# Patient Record
Sex: Female | Born: 1945
Health system: Southern US, Community
[De-identification: ages and names within clinical notes are randomized; demographics above are authoritative.]

## PROBLEM LIST (undated history)

## (undated) DIAGNOSIS — E079 Disorder of thyroid, unspecified: Secondary | ICD-10-CM

## (undated) DIAGNOSIS — I10 Essential (primary) hypertension: Secondary | ICD-10-CM

## (undated) DIAGNOSIS — C801 Malignant (primary) neoplasm, unspecified: Secondary | ICD-10-CM

## (undated) HISTORY — PX: ABDOMINAL HYSTERECTOMY: SHX81

## (undated) HISTORY — PX: BREAST SURGERY: SHX581

## (undated) HISTORY — PX: CHOLECYSTECTOMY: SHX55

## (undated) HISTORY — PX: MASTECTOMY: SHX3

---

## 2004-02-13 ENCOUNTER — Ambulatory Visit: Payer: Self-pay | Admitting: Internal Medicine

## 2010-01-23 ENCOUNTER — Ambulatory Visit (HOSPITAL_COMMUNITY): Admission: RE | Admit: 2010-01-23 | Discharge: 2010-01-23 | Payer: Self-pay | Admitting: Internal Medicine

## 2010-01-23 ENCOUNTER — Ambulatory Visit: Payer: Self-pay | Admitting: Internal Medicine

## 2011-08-29 ENCOUNTER — Encounter: Payer: Self-pay | Admitting: Internal Medicine

## 2011-08-29 DIAGNOSIS — M899 Disorder of bone, unspecified: Secondary | ICD-10-CM

## 2011-08-29 DIAGNOSIS — C50919 Malignant neoplasm of unspecified site of unspecified female breast: Secondary | ICD-10-CM

## 2011-08-29 DIAGNOSIS — M949 Disorder of cartilage, unspecified: Secondary | ICD-10-CM

## 2012-08-26 ENCOUNTER — Encounter (INDEPENDENT_AMBULATORY_CARE_PROVIDER_SITE_OTHER): Payer: Medicare Other | Admitting: Internal Medicine

## 2012-08-26 DIAGNOSIS — M899 Disorder of bone, unspecified: Secondary | ICD-10-CM

## 2012-08-26 DIAGNOSIS — C50919 Malignant neoplasm of unspecified site of unspecified female breast: Secondary | ICD-10-CM

## 2012-08-26 DIAGNOSIS — M949 Disorder of cartilage, unspecified: Secondary | ICD-10-CM

## 2015-03-08 DIAGNOSIS — Z853 Personal history of malignant neoplasm of breast: Secondary | ICD-10-CM | POA: Diagnosis not present

## 2015-03-08 DIAGNOSIS — Z9011 Acquired absence of right breast and nipple: Secondary | ICD-10-CM | POA: Diagnosis not present

## 2015-03-08 DIAGNOSIS — Z1231 Encounter for screening mammogram for malignant neoplasm of breast: Secondary | ICD-10-CM | POA: Diagnosis not present

## 2015-03-13 DIAGNOSIS — K8018 Calculus of gallbladder with other cholecystitis without obstruction: Secondary | ICD-10-CM | POA: Diagnosis not present

## 2015-03-13 DIAGNOSIS — K219 Gastro-esophageal reflux disease without esophagitis: Secondary | ICD-10-CM | POA: Diagnosis not present

## 2015-03-13 DIAGNOSIS — M1990 Unspecified osteoarthritis, unspecified site: Secondary | ICD-10-CM | POA: Diagnosis not present

## 2015-03-13 DIAGNOSIS — M47812 Spondylosis without myelopathy or radiculopathy, cervical region: Secondary | ICD-10-CM | POA: Diagnosis not present

## 2015-03-13 DIAGNOSIS — Z79899 Other long term (current) drug therapy: Secondary | ICD-10-CM | POA: Diagnosis not present

## 2015-03-13 DIAGNOSIS — K8050 Calculus of bile duct without cholangitis or cholecystitis without obstruction: Secondary | ICD-10-CM | POA: Diagnosis not present

## 2015-03-13 DIAGNOSIS — I10 Essential (primary) hypertension: Secondary | ICD-10-CM | POA: Diagnosis not present

## 2015-03-13 DIAGNOSIS — Z880 Allergy status to penicillin: Secondary | ICD-10-CM | POA: Diagnosis not present

## 2015-03-13 DIAGNOSIS — K8010 Calculus of gallbladder with chronic cholecystitis without obstruction: Secondary | ICD-10-CM | POA: Diagnosis not present

## 2015-03-13 DIAGNOSIS — Z853 Personal history of malignant neoplasm of breast: Secondary | ICD-10-CM | POA: Diagnosis not present

## 2015-03-13 DIAGNOSIS — Z9011 Acquired absence of right breast and nipple: Secondary | ICD-10-CM | POA: Diagnosis not present

## 2015-03-13 DIAGNOSIS — G629 Polyneuropathy, unspecified: Secondary | ICD-10-CM | POA: Diagnosis not present

## 2015-03-13 DIAGNOSIS — F419 Anxiety disorder, unspecified: Secondary | ICD-10-CM | POA: Diagnosis not present

## 2015-03-13 DIAGNOSIS — E039 Hypothyroidism, unspecified: Secondary | ICD-10-CM | POA: Diagnosis not present

## 2015-04-10 DIAGNOSIS — E039 Hypothyroidism, unspecified: Secondary | ICD-10-CM | POA: Diagnosis not present

## 2015-04-10 DIAGNOSIS — I10 Essential (primary) hypertension: Secondary | ICD-10-CM | POA: Diagnosis not present

## 2015-04-27 DIAGNOSIS — K219 Gastro-esophageal reflux disease without esophagitis: Secondary | ICD-10-CM | POA: Diagnosis not present

## 2015-04-27 DIAGNOSIS — Z87891 Personal history of nicotine dependence: Secondary | ICD-10-CM | POA: Diagnosis not present

## 2015-04-27 DIAGNOSIS — Z6823 Body mass index (BMI) 23.0-23.9, adult: Secondary | ICD-10-CM | POA: Diagnosis not present

## 2015-04-30 DIAGNOSIS — K219 Gastro-esophageal reflux disease without esophagitis: Secondary | ICD-10-CM | POA: Diagnosis not present

## 2015-05-02 ENCOUNTER — Emergency Department (HOSPITAL_COMMUNITY): Payer: Medicare Other

## 2015-05-02 ENCOUNTER — Encounter (HOSPITAL_COMMUNITY): Payer: Self-pay | Admitting: Emergency Medicine

## 2015-05-02 ENCOUNTER — Emergency Department (HOSPITAL_COMMUNITY)
Admission: EM | Admit: 2015-05-02 | Discharge: 2015-05-02 | Disposition: A | Discharge status: Home / Self Care | Payer: Medicare Other | Attending: Emergency Medicine | Admitting: Emergency Medicine

## 2015-05-02 DIAGNOSIS — R1011 Right upper quadrant pain: Secondary | ICD-10-CM | POA: Insufficient documentation

## 2015-05-02 DIAGNOSIS — R7989 Other specified abnormal findings of blood chemistry: Secondary | ICD-10-CM

## 2015-05-02 DIAGNOSIS — Z859 Personal history of malignant neoplasm, unspecified: Secondary | ICD-10-CM | POA: Insufficient documentation

## 2015-05-02 DIAGNOSIS — K5900 Constipation, unspecified: Secondary | ICD-10-CM

## 2015-05-02 DIAGNOSIS — R21 Rash and other nonspecific skin eruption: Secondary | ICD-10-CM | POA: Insufficient documentation

## 2015-05-02 DIAGNOSIS — Z8639 Personal history of other endocrine, nutritional and metabolic disease: Secondary | ICD-10-CM | POA: Insufficient documentation

## 2015-05-02 DIAGNOSIS — I10 Essential (primary) hypertension: Secondary | ICD-10-CM | POA: Insufficient documentation

## 2015-05-02 DIAGNOSIS — R110 Nausea: Secondary | ICD-10-CM | POA: Insufficient documentation

## 2015-05-02 DIAGNOSIS — Z9049 Acquired absence of other specified parts of digestive tract: Secondary | ICD-10-CM | POA: Diagnosis not present

## 2015-05-02 DIAGNOSIS — R946 Abnormal results of thyroid function studies: Secondary | ICD-10-CM | POA: Diagnosis not present

## 2015-05-02 HISTORY — DX: Essential (primary) hypertension: I10

## 2015-05-02 HISTORY — DX: Disorder of thyroid, unspecified: E07.9

## 2015-05-02 HISTORY — DX: Malignant (primary) neoplasm, unspecified: C80.1

## 2015-05-02 LAB — CBC WITH DIFFERENTIAL/PLATELET
BASOS ABS: 0 10*3/uL (ref 0.0–0.1)
Basophils Relative: 1 %
EOS ABS: 0.1 10*3/uL (ref 0.0–0.7)
EOS PCT: 1 %
HCT: 43.8 % (ref 36.0–46.0)
HEMOGLOBIN: 15.1 g/dL — AB (ref 12.0–15.0)
LYMPHS PCT: 14 %
Lymphs Abs: 0.9 10*3/uL (ref 0.7–4.0)
MCH: 32.6 pg (ref 26.0–34.0)
MCHC: 34.5 g/dL (ref 30.0–36.0)
MCV: 94.6 fL (ref 78.0–100.0)
Monocytes Absolute: 0.4 10*3/uL (ref 0.1–1.0)
Monocytes Relative: 6 %
NEUTROS PCT: 78 %
Neutro Abs: 5.1 10*3/uL (ref 1.7–7.7)
PLATELETS: 253 10*3/uL (ref 150–400)
RBC: 4.63 MIL/uL (ref 3.87–5.11)
RDW: 13.1 % (ref 11.5–15.5)
WBC: 6.5 10*3/uL (ref 4.0–10.5)

## 2015-05-02 LAB — COMPREHENSIVE METABOLIC PANEL
ALK PHOS: 59 U/L (ref 38–126)
ALT: 17 U/L (ref 14–54)
AST: 21 U/L (ref 15–41)
Albumin: 4.6 g/dL (ref 3.5–5.0)
Anion gap: 7 (ref 5–15)
BUN: 19 mg/dL (ref 6–20)
CHLORIDE: 104 mmol/L (ref 101–111)
CO2: 29 mmol/L (ref 22–32)
CREATININE: 0.84 mg/dL (ref 0.44–1.00)
Calcium: 9.7 mg/dL (ref 8.9–10.3)
GFR calc Af Amer: >60 mL/min (ref >60)
GFR calc non Af Amer: >60 mL/min (ref >60)
GLUCOSE: 106 mg/dL — AB (ref 65–99)
Potassium: 3.9 mmol/L (ref 3.5–5.1)
SODIUM: 140 mmol/L (ref 135–145)
Total Bilirubin: 1.3 mg/dL — ABNORMAL HIGH (ref 0.3–1.2)
Total Protein: 7.6 g/dL (ref 6.5–8.1)

## 2015-05-02 LAB — URINALYSIS, ROUTINE W REFLEX MICROSCOPIC
Bilirubin Urine: NEGATIVE
Glucose, UA: NEGATIVE mg/dL
Hgb urine dipstick: NEGATIVE
KETONES UR: NEGATIVE mg/dL
LEUKOCYTES UA: NEGATIVE
Nitrite: NEGATIVE
PH: 6.5 (ref 5.0–8.0)
Protein, ur: NEGATIVE mg/dL
Specific Gravity, Urine: 1.01 (ref 1.005–1.030)

## 2015-05-02 LAB — TSH
TSH: 11.697 u[IU]/mL — AB (ref 0.350–4.500)
TSH: 11.67 u[IU]/mL — AB (ref <=5.90)

## 2015-05-02 LAB — LIPASE, BLOOD: Lipase: 49 U/L (ref 11–51)

## 2015-05-02 MED ORDER — IOHEXOL 300 MG/ML  SOLN
100.0000 mL | Freq: Once | INTRAMUSCULAR | Status: AC | PRN
  Administered 2015-05-02: 100 mL via INTRAVENOUS

## 2015-05-02 MED ORDER — DIATRIZOATE MEGLUMINE & SODIUM 66-10 % PO SOLN
ORAL | Status: AC
  Filled 2015-05-02: qty 30

## 2015-05-02 NOTE — ED Notes (Signed)
C/o abdominal pain since 10 th of Jan.  Having pain on and off since surgery.  Currently rates pain 4/10.  Denies any n/v/d.

## 2015-05-02 NOTE — Discharge Instructions (Signed)
Abdominal Pain, Adult Many things can cause belly (abdominal) pain. Most times, the belly pain is not dangerous. Many cases of belly pain can be watched and treated at home. HOME CARE   Do not take medicines that help you go poop (laxatives) unless told to by your doctor.  Only take medicine as told by your doctor.  Eat or drink as told by your doctor. Your doctor will tell you if you should be on a special diet. GET HELP IF:  You do not know what is causing your belly pain.  You have belly pain while you are sick to your stomach (nauseous) or have runny poop (diarrhea).  You have pain while you pee or poop.  Your belly pain wakes you up at night.  You have belly pain that gets worse or better when you eat.  You have belly pain that gets worse when you eat fatty foods.  You have a fever. GET HELP RIGHT AWAY IF:   The pain does not go away within 2 hours.  You keep throwing up (vomiting).  The pain changes and is only in the right or left part of the belly.  You have bloody or tarry looking poop. MAKE SURE YOU:   Understand these instructions.  Will watch your condition.  Will get help right away if you are not doing well or get worse.   This information is not intended to replace advice given to you by your health care provider. Make sure you discuss any questions you have with your health care provider.   Document Released: 08/06/2007 Document Revised: 03/10/2014 Document Reviewed: 10/27/2012 Elsevier Interactive Patient Education 2016 [employer].  Constipation, Adult Constipation is when a person:  Poops (has a bowel movement) less than 3 times a week.  Has a hard time pooping.  Has poop that is dry, hard, or bigger than normal. HOME CARE   Eat foods with a lot of fiber in them. This includes fruits, vegetables, beans, and whole grains such as brown rice.  Avoid fatty foods and foods with a lot of sugar. This includes french fries, hamburgers, cookies,  candy, and soda.  If you are not getting enough fiber from food, take products with added fiber in them (supplements).  Drink enough fluid to keep your pee (urine) clear or pale yellow.  Exercise on a regular basis, or as told by your doctor.  Go to the restroom when you feel like you need to poop. Do not hold it.  Only take medicine as told by your doctor. Do not take medicines that help you poop (laxatives) without talking to your doctor first. GET HELP RIGHT AWAY IF:   You have bright red blood in your poop (stool).  Your constipation lasts more than 4 days or gets worse.  You have belly (abdominal) or butt (rectal) pain.  You have thin poop (as thin as a pencil).  You lose weight, and it cannot be explained. MAKE SURE YOU:   Understand these instructions.  Will watch your condition.  Will get help right away if you are not doing well or get worse.   This information is not intended to replace advice given to you by your health care provider. Make sure you discuss any questions you have with your health care provider.   Document Released: 08/06/2007 Document Revised: 03/10/2014 Document Reviewed: 11/29/2012 Elsevier Interactive Patient Education 2016 [employer].  Hypothyroidism Hypothyroidism is a disorder of the thyroid. The thyroid is a large gland that  is located in the lower front of the neck. The thyroid releases hormones that control how the body works. With hypothyroidism, the thyroid does not make enough of these hormones. CAUSES Causes of hypothyroidism may include:  Viral infections.  Pregnancy.  Your own defense system (immune system) attacking your thyroid.  Certain medicines.  Birth defects.  Past radiation treatments to your head or neck.  Past treatment with radioactive iodine.  Past surgical removal of part or all of your thyroid.  Problems with the gland that is located in the center of your brain (pituitary). SIGNS AND SYMPTOMS Signs  and symptoms of hypothyroidism may include:  Feeling as though you have no energy (lethargy).  Inability to tolerate cold.  Weight gain that is not explained by a change in diet or exercise habits.  Dry skin.  Coarse hair.  Menstrual irregularity.  Slowing of thought processes.  Constipation.  Sadness or depression. DIAGNOSIS  Your health care provider may diagnose hypothyroidism with blood tests and ultrasound tests. TREATMENT Hypothyroidism is treated with medicine that replaces the hormones that your body does not make. After you begin treatment, it may take several weeks for symptoms to go away. HOME CARE INSTRUCTIONS   Take medicines only as directed by your health care provider.  If you start taking any new medicines, tell your health care provider.  Keep all follow-up visits as directed by your health care provider. This is important. As your condition improves, your dosage needs may change. You will need to have blood tests regularly so that your health care provider can watch your condition. SEEK MEDICAL CARE IF:  Your symptoms do not get better with treatment.  You are taking thyroid replacement medicine and:  You sweat excessively.  You have tremors.  You feel anxious.  You lose weight rapidly.  You cannot tolerate heat.  You have emotional swings.  You have diarrhea.  You feel weak. SEEK IMMEDIATE MEDICAL CARE IF:   You develop chest pain.  You develop an irregular heartbeat.  You develop a rapid heartbeat.   This information is not intended to replace advice given to you by your health care provider. Make sure you discuss any questions you have with your health care provider.   Document Released: 02/17/2005 Document Revised: 03/10/2014 Document Reviewed: 07/05/2013 Elsevier Interactive Patient Education [employer].

## 2015-05-02 NOTE — ED Provider Notes (Signed)
CSN: [id number]     Arrival date & time 05/02/15  [id number] History   First MD Initiated Contact with Patient 05/02/15 0840     Chief Complaint  Patient presents with  . Abdominal Pain     (Consider location/radiation/quality/duration/timing/severity/associated sxs/prior Treatment) HPI   Lori Dorsey is a 70 y.o. female who presents to the Emergency Department complaining of intermittent right upper abdominal pain for one month.  She is s/p cholecystectomy in January and reports frequent, worsening pains since that time.  She reports associated nausea, and feeling "bloated" and burning to her stomach.  She followed up with her surgeon in early February and he has released her.  She states pain is not associated with food.  Nothing seems to make the pain better or worse.  She denies chest pain, shortness of breath, fever, vomiting or significant diarrhea.  She also reports a persistent rash to both lower extremities that has been worsening for several weeks.  She states that she has had this rash for some time and her PMD states the rash is associated with her thyroid medication.  She is concerned that her thyroid levels may be abnormal.    Past Medical History  Diagnosis Date  . Hypertension   . Thyroid disease   . Cancer [facility])    Past Surgical History  Procedure Laterality Date  . Abdominal hysterectomy    . Mastectomy      right  . Breast surgery    . Cholecystectomy      Jan. 10 th 2017   History reviewed. No pertinent family history. Social History  Substance Use Topics  . Smoking status: Never Smoker   . Smokeless tobacco: None  . Alcohol Use: No   OB History    No data available     Review of Systems  Constitutional: Positive for appetite change. Negative for fever, chills and activity change.  HENT: Negative for trouble swallowing.   Respiratory: Negative for chest tightness and shortness of breath.   Cardiovascular: Negative for chest pain.  Gastrointestinal:  Positive for nausea and abdominal pain. Negative for vomiting, diarrhea and blood in stool.  Genitourinary: Negative for dysuria, flank pain, decreased urine volume and difficulty urinating.  Musculoskeletal: Negative for back pain.  Skin: Positive for rash. Negative for color change.  Neurological: Negative for dizziness, weakness and numbness.  Hematological: Negative for adenopathy.  All other systems reviewed and are negative.     Allergies  Review of patient's allergies indicates no known allergies.  Home Medications   Prior to Admission medications   Not on File   BP 185/94 mmHg  Pulse 84  Temp(Src) 97.7 F (36.5 C) (Oral)  Resp 16  SpO2 100% Physical Exam  Constitutional: She is oriented to person, place, and time. She appears well-developed and well-nourished. No distress.  HENT:  Head: Normocephalic and atraumatic.  Mouth/Throat: Oropharynx is clear and moist.  Cardiovascular: Normal rate, regular rhythm, normal heart sounds and intact distal pulses.   No murmur heard. Pulmonary/Chest: Effort normal and breath sounds normal. No respiratory distress.  Abdominal: Soft. Bowel sounds are normal. She exhibits no distension and no mass. There is tenderness. There is no rebound and no guarding.  Mild RUQ tenderness on exam.  No guarding or rebound tenderness.  Surgical incisions appear well healed.  No surrounding erythema  Musculoskeletal: Normal range of motion. She exhibits no edema.  Neurological: She is alert and oriented to person, place, and time. She exhibits normal muscle tone.  Coordination normal.  Skin: Skin is warm and dry. Rash noted.  Macular, erythematous rash to the bilateral LE's    Nursing note and vitals reviewed.   ED Course  Procedures (including critical care time) Labs Review Labs Reviewed  CBC WITH DIFFERENTIAL/PLATELET - Abnormal; Notable for the following:    Hemoglobin 15.1 (*)    All other components within normal limits  COMPREHENSIVE  METABOLIC PANEL - Abnormal; Notable for the following:    Glucose, Bld 106 (*)    Total Bilirubin 1.3 (*)    All other components within normal limits  TSH - Abnormal; Notable for the following:    TSH 11.697 (*)    All other components within normal limits  LIPASE, BLOOD  URINALYSIS, ROUTINE W REFLEX MICROSCOPIC (NOT AT [facility])    Imaging Review Ct Abdomen Pelvis W Contrast  05/02/2015  CLINICAL DATA:  Right upper quadrant pain.  Recent cholecystectomy. EXAM: CT ABDOMEN AND PELVIS WITH CONTRAST TECHNIQUE: Multidetector CT imaging of the abdomen and pelvis was performed using the standard protocol following bolus administration of intravenous contrast. CONTRAST:  111mL OMNIPAQUE IOHEXOL 300 MG/ML  SOLN COMPARISON:  02/14/2015 FINDINGS: Lower chest: Lung bases are clear. No effusions. Heart is normal size. Hepatobiliary: Prior cholecystectomy. Focal fatty infiltration near the falciform ligament. No focal abnormalities otherwise. No biliary duct dilatation. Pancreas: Normal Spleen: Normal Adrenals/Urinary Tract: Mild fullness of the right adrenal gland compatible with hyperplasia, stable. Left adrenal and kidneys are unremarkable. Small cyst in the lower pole of the right kidney. No hydronephrosis. Stomach/Bowel: Moderate stool burden throughout the colon. Stomach and small bowel are decompressed. Appendix is visualized and is normal. Vascular/Lymphatic: Aorta is normal caliber.  No adenopathy. Other: No free fluid or free air. Musculoskeletal: No acute bony abnormality or focal bone lesion. Cough IMPRESSION: Prior cholecystectomy.  No acute findings in the abdomen or pelvis. Electronically Signed   By: Rolm Baptise M.D.   On: 05/02/2015 10:46   I have personally reviewed and evaluated these images and lab results as part of my medical decision-making.   EKG Interpretation None      MDM   Final diagnoses:  Right upper quadrant pain  Constipation, unspecified constipation type  Elevated TSH     Pt is well appearing, non-toxic.  Mild RUQ pain, s/p 2 months cholecystectomy.  Labs and CT scan results are reassuring.  No concerning sx's for acute abdomen.  Discussed Miralax for constipation relief and possible stone of CBD.  She appears stable for d/c and agrees to tx plan and close f/u with her surgeon if no relief from the miralax.  Also agrees to close PMD f/u regarding her elevated TSH    Kem Parkinson, PA-C 05/03/15 1716  Pattricia Boss, MD 05/15/15 [id number]

## 2015-05-21 DIAGNOSIS — I10 Essential (primary) hypertension: Secondary | ICD-10-CM | POA: Diagnosis not present

## 2015-05-21 DIAGNOSIS — F329 Major depressive disorder, single episode, unspecified: Secondary | ICD-10-CM | POA: Diagnosis not present

## 2015-06-14 ENCOUNTER — Encounter: Payer: Self-pay | Admitting: "Endocrinology

## 2015-06-14 ENCOUNTER — Ambulatory Visit (INDEPENDENT_AMBULATORY_CARE_PROVIDER_SITE_OTHER): Payer: Medicare Other | Admitting: "Endocrinology

## 2015-06-14 VITALS — BP 103/60 | HR 72 | Ht 67.0 in | Wt 149.0 lb

## 2015-06-14 DIAGNOSIS — E063 Autoimmune thyroiditis: Secondary | ICD-10-CM

## 2015-06-14 DIAGNOSIS — E039 Hypothyroidism, unspecified: Secondary | ICD-10-CM | POA: Diagnosis not present

## 2015-06-14 DIAGNOSIS — E038 Other specified hypothyroidism: Secondary | ICD-10-CM | POA: Insufficient documentation

## 2015-06-14 MED ORDER — LEVOTHYROXINE SODIUM 100 MCG PO TABS: 100.0000 ug | ORAL_TABLET | Freq: Every day | ORAL | Status: DC

## 2015-06-14 NOTE — Progress Notes (Signed)
Subjective:    Patient ID: Lori Dorsey, female    DOB: 1945/05/11, PCP Monico Blitz, MD   Past Medical History  Diagnosis Date  . Hypertension   . Thyroid disease   . Cancer [facility])    Past Surgical History  Procedure Laterality Date  . Abdominal hysterectomy    . Mastectomy      right  . Breast surgery    . Cholecystectomy      Jan. 10 th 2017   Social History   Social History  . Marital Status: Married    Spouse Name: N/A  . Number of Children: N/A  . Years of Education: N/A   Social History Main Topics  . Smoking status: Never Smoker   . Smokeless tobacco: None  . Alcohol Use: No  . Drug Use: No  . Sexual Activity: Not Asked   Other Topics Concern  . None   Social History Narrative   Outpatient Encounter Prescriptions as of 06/14/2015  Medication Sig  . atenolol (TENORMIN) 25 MG tablet Take 25 mg by mouth 2 (two) times daily.  . Cholecalciferol (VITAMIN D) 2000 units CAPS Take by mouth.  . folic acid (FOLVITE) 299 MCG tablet Take 800 mcg by mouth daily.  Marland Kitchen levothyroxine (SYNTHROID, LEVOTHROID) 100 MCG tablet Take 1 tablet (100 mcg total) by mouth daily before breakfast.  . Magnesium 250 MG TABS Take by mouth 2 (two) times daily.  Marland Kitchen pyridOXINE (VITAMIN B-6) 100 MG tablet Take 100 mg by mouth daily.  . [DISCONTINUED] levothyroxine (SYNTHROID, LEVOTHROID) 88 MCG tablet Take 88 mcg by mouth daily before breakfast.  . [DISCONTINUED] ALPRAZolam (XANAX) 0.25 MG tablet Take 0.25 mg by mouth at bedtime as needed for anxiety.  . [DISCONTINUED] atenolol (TENORMIN) 50 MG tablet Take 25 mg by mouth 2 (two) times daily.  . [DISCONTINUED] folic acid (FOLVITE) 1 MG tablet Take 1 mg by mouth daily.  . [DISCONTINUED] levothyroxine (SYNTHROID, LEVOTHROID) 100 MCG tablet Take 100 mcg by mouth daily before breakfast.  . [DISCONTINUED] omeprazole (PRILOSEC) 40 MG capsule Take 40 mg by mouth daily.   No facility-administered encounter medications on file as of 06/14/2015.    ALLERGIES: No Known Allergies VACCINATION STATUS:  There is no immunization history on file for this patient.  HPI  70 year old female patient with the medical history as above. She is being seen in consultation for long-standing hypothyroidism requested by Dr. Monico Blitz. -She denies history of goiter nor treatment with antithyroid medications. She has taken various doses of thyroid hormone over the last 30 years. Lately her thyroid function tests have been fluctuating. She is currently on Synthroid 88 g by mouth every morning. Her most recent TSH is elevated at 11.67. She has family history of thyroid dysfunction in one of her siblings and one of her daughters. -She denies any family history of thyroid cancer. She reports compliance to her medications.  Review of Systems  Constitutional: She has a stable weight,  + fatigue, no subjective hyperthermia/hypothermia Eyes: no blurry vision, no xerophthalmia ENT: no sore throat, no nodules palpated in throat, no dysphagia/odynophagia, no hoarseness Cardiovascular: no CP/SOB/palpitations/leg swelling Respiratory: no cough/SOB Gastrointestinal: no N/V/D/C Musculoskeletal: no muscle/joint aches Skin: no rashes Neurological: no tremors/numbness/tingling/dizziness Psychiatric: no depression/anxiety  Objective:    BP 103/60 mmHg  Pulse 72  Ht 5' 7"  (1.702 m)  Wt 149 lb (67.586 kg)  BMI 23.33 kg/m2  SpO2 96%  Wt Readings from Last 3 Encounters:  06/14/15 149 lb (67.586 kg)  Physical Exam  Constitutional:  in NAD Eyes: PERRLA, EOMI, no exophthalmos ENT: moist mucous membranes, no thyromegaly, no cervical lymphadenopathy Cardiovascular: RRR, No MRG Respiratory: CTA B Gastrointestinal: abdomen soft, NT, ND, BS+ Musculoskeletal: no deformities, strength intact in all 4 Skin: moist, warm, no rashes Neurological: no tremor with outstretched hands, DTR normal in all 4   Recent Results (from the past 2160 hour(s))  TSH      Status: Abnormal   Collection Time: 05/02/15 12:00 AM  Result Value Ref Range   TSH 11.67 (A) .41 - 5.90 uIU/mL  CBC with Differential     Status: Abnormal   Collection Time: 05/02/15  9:21 AM  Result Value Ref Range   WBC 6.5 4.0 - 10.5 K/uL   RBC 4.63 3.87 - 5.11 MIL/uL   Hemoglobin 15.1 (H) 12.0 - 15.0 g/dL   HCT 43.8 36.0 - 46.0 %   MCV 94.6 78.0 - 100.0 fL   MCH 32.6 26.0 - 34.0 pg   MCHC 34.5 30.0 - 36.0 g/dL   RDW 13.1 11.5 - 15.5 %   Platelets 253 150 - 400 K/uL   Neutrophils Relative % 78 %   Neutro Abs 5.1 1.7 - 7.7 K/uL   Lymphocytes Relative 14 %   Lymphs Abs 0.9 0.7 - 4.0 K/uL   Monocytes Relative 6 %   Monocytes Absolute 0.4 0.1 - 1.0 K/uL   Eosinophils Relative 1 %   Eosinophils Absolute 0.1 0.0 - 0.7 K/uL   Basophils Relative 1 %   Basophils Absolute 0.0 0.0 - 0.1 K/uL  Comprehensive metabolic panel     Status: Abnormal   Collection Time: 05/02/15  9:21 AM  Result Value Ref Range   Sodium 140 135 - 145 mmol/L   Potassium 3.9 3.5 - 5.1 mmol/L   Chloride 104 101 - 111 mmol/L   CO2 29 22 - 32 mmol/L   Glucose, Bld 106 (H) 65 - 99 mg/dL   BUN 19 6 - 20 mg/dL   Creatinine, Ser 0.84 0.44 - 1.00 mg/dL   Calcium 9.7 8.9 - 10.3 mg/dL   Total Protein 7.6 6.5 - 8.1 g/dL   Albumin 4.6 3.5 - 5.0 g/dL   AST 21 15 - 41 U/L   ALT 17 14 - 54 U/L   Alkaline Phosphatase 59 38 - 126 U/L   Total Bilirubin 1.3 (H) 0.3 - 1.2 mg/dL   GFR calc non Af Amer >60 >60 mL/min   GFR calc Af Amer >60 >60 mL/min    Comment: (NOTE) The eGFR has been calculated using the CKD EPI equation. This calculation has not been validated in all clinical situations. eGFR's persistently <60 mL/min signify possible Chronic Kidney Disease.    Anion gap 7 5 - 15  Lipase, blood     Status: None   Collection Time: 05/02/15  9:21 AM  Result Value Ref Range   Lipase 49 11 - 51 U/L  TSH     Status: Abnormal   Collection Time: 05/02/15  9:21 AM  Result Value Ref Range   TSH 11.697 (H) 0.350 - 4.500  uIU/mL  Urinalysis, Routine w reflex microscopic (not at [facility])     Status: None   Collection Time: 05/02/15  9:42 AM  Result Value Ref Range   Color, Urine YELLOW YELLOW   APPearance CLEAR CLEAR   Specific Gravity, Urine 1.010 1.005 - 1.030   pH 6.5 5.0 - 8.0   Glucose, UA NEGATIVE NEGATIVE mg/dL   Hgb urine  dipstick NEGATIVE NEGATIVE   Bilirubin Urine NEGATIVE NEGATIVE   Ketones, ur NEGATIVE NEGATIVE mg/dL   Protein, ur NEGATIVE NEGATIVE mg/dL   Nitrite NEGATIVE NEGATIVE   Leukocytes, UA NEGATIVE NEGATIVE    Comment: MICROSCOPIC NOT DONE ON URINES WITH NEGATIVE PROTEIN, BLOOD, LEUKOCYTES, NITRITE, OR GLUCOSE <1000 mg/dL.   05/02/2015 TSH elevated at 11.67 04/11/2015 SH elevated at 6.29 January 2015 free T4 1.79.   Assessment & Plan:   1. Hypothyroidism, unspecified hypothyroidism type - I have reviewed her available thyroid records and evaluated patient clinically. -Based on her history and duration of her hypothyroidism she has, she would benefit from slight increase in her thyroid hormone. I will increase her Synthroid 200 g by mouth every morning.  - We discussed about correct intake of levothyroxine, at fasting, with water, separated by at least 30 minutes from breakfast, and separated by more than 4 hours from calcium, iron, multivitamins, acid reflux medications (PPIs). -Patient is made aware of the fact that thyroid hormone replacement is needed for life, dose to be adjusted by periodic monitoring of thyroid function tests.  -Clinically she has no goiter, hence no need for imaging studies at this time.   - I advised patient to maintain close follow up with [facility], MD for primary care needs. Follow up plan: Return in about 9 weeks (around 08/16/2015) for underactive thyroid, follow up with pre-visit labs.  [name], MD Phone: [phone]  Fax: [phone]   06/14/2015, 3:15 PM

## 2015-06-20 DIAGNOSIS — R5383 Other fatigue: Secondary | ICD-10-CM | POA: Diagnosis not present

## 2015-06-20 DIAGNOSIS — E559 Vitamin D deficiency, unspecified: Secondary | ICD-10-CM | POA: Diagnosis not present

## 2015-06-20 DIAGNOSIS — Z299 Encounter for prophylactic measures, unspecified: Secondary | ICD-10-CM | POA: Diagnosis not present

## 2015-06-20 DIAGNOSIS — Z7189 Other specified counseling: Secondary | ICD-10-CM | POA: Diagnosis not present

## 2015-06-20 DIAGNOSIS — E7800 Pure hypercholesterolemia, unspecified: Secondary | ICD-10-CM | POA: Diagnosis not present

## 2015-06-20 DIAGNOSIS — Z6823 Body mass index (BMI) 23.0-23.9, adult: Secondary | ICD-10-CM | POA: Diagnosis not present

## 2015-06-20 DIAGNOSIS — Z1389 Encounter for screening for other disorder: Secondary | ICD-10-CM | POA: Diagnosis not present

## 2015-06-20 DIAGNOSIS — Z1211 Encounter for screening for malignant neoplasm of colon: Secondary | ICD-10-CM | POA: Diagnosis not present

## 2015-06-20 DIAGNOSIS — Z0000 Encounter for general adult medical examination without abnormal findings: Secondary | ICD-10-CM | POA: Diagnosis not present

## 2015-06-20 DIAGNOSIS — E039 Hypothyroidism, unspecified: Secondary | ICD-10-CM | POA: Diagnosis not present

## 2015-08-07 DIAGNOSIS — K219 Gastro-esophageal reflux disease without esophagitis: Secondary | ICD-10-CM | POA: Diagnosis not present

## 2015-08-07 DIAGNOSIS — R350 Frequency of micturition: Secondary | ICD-10-CM | POA: Diagnosis not present

## 2015-08-07 DIAGNOSIS — I10 Essential (primary) hypertension: Secondary | ICD-10-CM | POA: Diagnosis not present

## 2015-08-07 DIAGNOSIS — N390 Urinary tract infection, site not specified: Secondary | ICD-10-CM | POA: Diagnosis not present

## 2015-08-10 ENCOUNTER — Other Ambulatory Visit: Payer: Self-pay | Admitting: "Endocrinology

## 2015-08-10 DIAGNOSIS — E039 Hypothyroidism, unspecified: Secondary | ICD-10-CM | POA: Diagnosis not present

## 2015-08-10 LAB — TSH: TSH: 19.48 mIU/L — ABNORMAL HIGH

## 2015-08-10 LAB — T3, FREE: T3 FREE: 1.9 pg/mL — AB (ref 2.3–4.2)

## 2015-08-10 LAB — T4, FREE: FREE T4: 1.3 ng/dL (ref 0.8–1.8)

## 2015-08-11 LAB — THYROID PEROXIDASE ANTIBODY: THYROID PEROXIDASE ANTIBODY: 21 [IU]/mL — AB (ref <9)

## 2015-08-11 LAB — THYROGLOBULIN ANTIBODY: THYROGLOBULIN AB: 224 [IU]/mL — AB (ref <2)

## 2015-08-20 ENCOUNTER — Ambulatory Visit (INDEPENDENT_AMBULATORY_CARE_PROVIDER_SITE_OTHER): Payer: Medicare Other | Admitting: "Endocrinology

## 2015-08-20 ENCOUNTER — Encounter: Payer: Self-pay | Admitting: "Endocrinology

## 2015-08-20 VITALS — BP 129/87 | HR 72 | Ht 67.0 in | Wt 155.0 lb

## 2015-08-20 DIAGNOSIS — E038 Other specified hypothyroidism: Secondary | ICD-10-CM

## 2015-08-20 MED ORDER — LEVOTHYROXINE SODIUM 112 MCG PO TABS: 112.0000 ug | ORAL_TABLET | Freq: Every day | ORAL | Status: DC

## 2015-08-20 MED ORDER — LEVOTHYROXINE SODIUM 112 MCG PO TABS: 100.0000 ug | ORAL_TABLET | Freq: Every day | ORAL | Status: DC

## 2015-08-20 NOTE — Progress Notes (Signed)
Subjective:    Patient ID: Lori Dorsey, female    DOB: 1945-09-13, PCP Monico Blitz, MD   Past Medical History  Diagnosis Date  . Hypertension   . Thyroid disease   . Cancer [facility])    Past Surgical History  Procedure Laterality Date  . Abdominal hysterectomy    . Mastectomy      right  . Breast surgery    . Cholecystectomy      Jan. 10 th 2017   Social History   Social History  . Marital Status: Married    Spouse Name: N/A  . Number of Children: N/A  . Years of Education: N/A   Social History Main Topics  . Smoking status: Never Smoker   . Smokeless tobacco: None  . Alcohol Use: No  . Drug Use: No  . Sexual Activity: Not Asked   Other Topics Concern  . None   Social History Narrative   Outpatient Encounter Prescriptions as of 08/20/2015  Medication Sig  . atenolol (TENORMIN) 25 MG tablet Take 25 mg by mouth 2 (two) times daily.  . Cholecalciferol (VITAMIN D) 2000 units CAPS Take by mouth.  . folic acid (FOLVITE) Q000111Q MCG tablet Take 800 mcg by mouth daily.  Marland Kitchen levothyroxine (SYNTHROID, LEVOTHROID) 112 MCG tablet Take 1 tablet (112 mcg total) by mouth daily before breakfast.  . Magnesium 250 MG TABS Take by mouth 2 (two) times daily.  Marland Kitchen pyridOXINE (VITAMIN B-6) 100 MG tablet Take 100 mg by mouth daily.  . [DISCONTINUED] levothyroxine (SYNTHROID, LEVOTHROID) 100 MCG tablet Take 1 tablet (100 mcg total) by mouth daily before breakfast.  . [DISCONTINUED] levothyroxine (SYNTHROID, LEVOTHROID) 112 MCG tablet Take 1 tablet (112 mcg total) by mouth daily before breakfast.   No facility-administered encounter medications on file as of 08/20/2015.   ALLERGIES: No Known Allergies VACCINATION STATUS:  There is no immunization history on file for this patient.  HPI  70 year old female patient with the medical history as above. She is Here to follow-up for long-standing hypothyroidism.   She has taken various doses of thyroid hormone over the last 30 years. She  is currently on Synthroid 100 g by mouth every morning. Her most recent TFTs still suggest that she is under replaced. She has family history of thyroid dysfunction in one of her siblings and one of her daughters. -She denies any family history of thyroid cancer. She reports compliance to her medications.  Review of Systems  Constitutional: She has a stable weight,  + fatigue, no subjective hyperthermia/hypothermia Eyes: no blurry vision, no xerophthalmia ENT: no sore throat, no nodules palpated in throat, no dysphagia/odynophagia, no hoarseness Cardiovascular: no CP/SOB/palpitations/leg swelling Respiratory: no cough/SOB Gastrointestinal: no N/V/D/C Musculoskeletal: no muscle/joint aches Skin: no rashes Neurological: no tremors/numbness/tingling/dizziness Psychiatric: no depression/anxiety  Objective:    BP 129/87 mmHg  Pulse 72  Ht 5\' 7"  (1.702 m)  Wt 155 lb (70.308 kg)  BMI 24.27 kg/m2  SpO2 98%  Wt Readings from Last 3 Encounters:  08/20/15 155 lb (70.308 kg)  06/14/15 149 lb (67.586 kg)    Physical Exam  Constitutional:  in NAD Eyes: PERRLA, EOMI, no exophthalmos ENT: moist mucous membranes, no thyromegaly, no cervical lymphadenopathy Cardiovascular: RRR, No MRG Respiratory: CTA B Gastrointestinal: abdomen soft, NT, ND, BS+ Musculoskeletal: no deformities, strength intact in all 4 Skin: moist, warm, no rashes Neurological: no tremor with outstretched hands, DTR normal in all 4   Recent Results (from the past 2160 hour(s))  TSH  Status: Abnormal   Collection Time: 08/10/15  8:54 AM  Result Value Ref Range   TSH 19.48 (H) mIU/L    Comment:   Reference Range   > or = 20 Years  0.40-4.50   Pregnancy Range First trimester  0.26-2.66 Second trimester 0.55-2.73 Third trimester  0.43-2.91     T4, free     Status: None   Collection Time: 08/10/15  8:54 AM  Result Value Ref Range   Free T4 1.3 0.8 - 1.8 ng/dL  T3, free     Status: Abnormal   Collection  Time: 08/10/15  8:54 AM  Result Value Ref Range   T3, Free 1.9 (L) 2.3 - 4.2 pg/mL  Thyroid peroxidase antibody     Status: Abnormal   Collection Time: 08/10/15  8:54 AM  Result Value Ref Range   Thyroperoxidase Ab SerPl-aCnc 21 (H) <9 IU/mL  Thyroglobulin antibody     Status: Abnormal   Collection Time: 08/10/15  8:54 AM  Result Value Ref Range   Thyroglobulin Ab 224 (H) <2 IU/mL      Assessment & Plan:   1. Hypothyroidism -The cause for her hypothyroidism is Hashimoto's thyroiditis. -Based on her history and duration of her hypothyroidism she has, she would benefit from slight increase in her thyroid hormone. I will increase her Synthroid  to 112 g by mouth every morning.  - We discussed about correct intake of levothyroxine, at fasting, with water, separated by at least 30 minutes from breakfast, and separated by more than 4 hours from calcium, iron, multivitamins, acid reflux medications (PPIs). -Patient is made aware of the fact that thyroid hormone replacement is needed for life, dose to be adjusted by periodic monitoring of thyroid function tests.  -Clinically she has no goiter, hence no need for imaging studies at this time.   - I advised patient to maintain close follow up with [facility], MD for primary care needs. Follow up plan: Return in about 4 months (around 12/20/2015) for underactive thyroid, follow up with pre-visit labs.  [name], MD Phone: [phone]  Fax: [phone]   08/20/2015, 1:35 PM

## 2015-11-07 DIAGNOSIS — M25562 Pain in left knee: Secondary | ICD-10-CM | POA: Diagnosis not present

## 2015-11-14 DIAGNOSIS — Z961 Presence of intraocular lens: Secondary | ICD-10-CM | POA: Diagnosis not present

## 2015-11-14 DIAGNOSIS — Z9849 Cataract extraction status, unspecified eye: Secondary | ICD-10-CM | POA: Diagnosis not present

## 2015-12-06 DIAGNOSIS — Z23 Encounter for immunization: Secondary | ICD-10-CM | POA: Diagnosis not present

## 2015-12-12 ENCOUNTER — Other Ambulatory Visit: Payer: Self-pay | Admitting: "Endocrinology

## 2015-12-12 DIAGNOSIS — E038 Other specified hypothyroidism: Secondary | ICD-10-CM | POA: Diagnosis not present

## 2015-12-12 LAB — TSH: TSH: 2.84 m[IU]/L

## 2015-12-12 LAB — T4, FREE: FREE T4: 1.6 ng/dL (ref 0.8–1.8)

## 2015-12-12 LAB — T3, FREE: T3 FREE: 2.8 pg/mL (ref 2.3–4.2)

## 2015-12-19 ENCOUNTER — Ambulatory Visit (INDEPENDENT_AMBULATORY_CARE_PROVIDER_SITE_OTHER): Payer: Medicare Other | Admitting: "Endocrinology

## 2015-12-19 ENCOUNTER — Encounter: Payer: Self-pay | Admitting: "Endocrinology

## 2015-12-19 VITALS — BP 137/88 | HR 71 | Ht 67.0 in | Wt 158.0 lb

## 2015-12-19 DIAGNOSIS — E038 Other specified hypothyroidism: Secondary | ICD-10-CM

## 2015-12-19 DIAGNOSIS — E063 Autoimmune thyroiditis: Secondary | ICD-10-CM | POA: Diagnosis not present

## 2015-12-19 NOTE — Progress Notes (Signed)
Subjective:    Patient ID: Lori Dorsey, female    DOB: [birth year], PCP Monico Blitz, MD   Past Medical History:  Diagnosis Date  . Cancer ([place])   . Hypertension   . Thyroid disease    Past Surgical History:  Procedure Laterality Date  . ABDOMINAL HYSTERECTOMY    . BREAST SURGERY    . CHOLECYSTECTOMY     Jan. 10 th 2017  . MASTECTOMY     right   Social History   Social History  . Marital status: Married    Spouse name: N/A  . Number of children: N/A  . Years of education: N/A   Social History Main Topics  . Smoking status: Never Smoker  . Smokeless tobacco: None  . Alcohol use No  . Drug use: No  . Sexual activity: Not Asked   Other Topics Concern  . None   Social History Narrative  . None   Outpatient Encounter Prescriptions as of 12/19/2015  Medication Sig  . atenolol (TENORMIN) 25 MG tablet Take 25 mg by mouth 2 (two) times daily.  . Cholecalciferol (VITAMIN D) 2000 units CAPS Take by mouth.  . folic acid (FOLVITE) Q000111Q MCG tablet Take 800 mcg by mouth daily.  Marland Kitchen levothyroxine (SYNTHROID, LEVOTHROID) 112 MCG tablet Take 1 tablet (112 mcg total) by mouth daily before breakfast.  . Magnesium 250 MG TABS Take by mouth 2 (two) times daily.  Marland Kitchen pyridOXINE (VITAMIN B-6) 100 MG tablet Take 100 mg by mouth daily.   No facility-administered encounter medications on file as of 12/19/2015.    ALLERGIES: No Known Allergies VACCINATION STATUS:  There is no immunization history on file for this patient.  HPI  70 year old female patient with the medical history as above. She is Here to follow-up for long-standing hypothyroidism.   She has taken various doses of thyroid hormone over the last 30 years. She is currently on Synthroid 112 g by mouth every morning. Her most recent TFTs still suggest that she is appropriately replaced. She has family history of thyroid dysfunction in one of her siblings and one of her daughters. -She denies any family history of  thyroid cancer. She reports compliance to her medications.  Review of Systems  Constitutional: She has a stable weight,  + fatigue, no subjective hyperthermia/hypothermia Eyes: no blurry vision, no xerophthalmia ENT: no sore throat, no nodules palpated in throat, no dysphagia/odynophagia, no hoarseness Cardiovascular: no CP/SOB/palpitations/leg swelling Respiratory: no cough/SOB Gastrointestinal: no N/V/D/C Musculoskeletal: no muscle/joint aches Skin: no rashes Neurological: no tremors/numbness/tingling/dizziness Psychiatric: no depression/anxiety  Objective:    BP 137/88   Pulse 71   Ht 5\' 7"  (1.702 m)   Wt 158 lb (71.7 kg)   BMI 24.75 kg/m   Wt Readings from Last 3 Encounters:  12/19/15 158 lb (71.7 kg)  08/20/15 155 lb (70.3 kg)  06/14/15 149 lb (67.6 kg)    Physical Exam  Constitutional:  in NAD Eyes: PERRLA, EOMI, no exophthalmos ENT: moist mucous membranes, no thyromegaly, no cervical lymphadenopathy Cardiovascular: RRR, No MRG Respiratory: CTA B Gastrointestinal: abdomen soft, NT, ND, BS+ Musculoskeletal: no deformities, strength intact in all 4 Skin: moist, warm, no rashes Neurological: no tremor with outstretched hands, DTR normal in all 4   Recent Results (from the past 2160 hour(s))  TSH     Status: None   Collection Time: 12/12/15 10:29 AM  Result Value Ref Range   TSH 2.84 mIU/L    Comment:   Reference Range   >  or = 20 Years  0.40-4.50   Pregnancy Range First trimester  0.26-2.66 Second trimester 0.55-2.73 Third trimester  0.43-2.91     T4, free     Status: None   Collection Time: 12/12/15 10:29 AM  Result Value Ref Range   Free T4 1.6 0.8 - 1.8 ng/dL  T3, free     Status: None   Collection Time: 12/12/15 10:29 AM  Result Value Ref Range   T3, Free 2.8 2.3 - 4.2 pg/mL      Assessment & Plan:   1. Hypothyroidism -The cause for her hypothyroidism is Hashimoto's thyroiditis. - Her thyroid function tests are consistent with  appropriate replacement. -I will continue Synthroid   112 g by mouth every morning.  - We discussed about correct intake of levothyroxine, at fasting, with water, separated by at least 30 minutes from breakfast, and separated by more than 4 hours from calcium, iron, multivitamins, acid reflux medications (PPIs). -Patient is made aware of the fact that thyroid hormone replacement is needed for life, dose to be adjusted by periodic monitoring of thyroid function tests.  -Clinically she has no goiter, hence no need for imaging studies at this time.   - I advised patient to maintain close follow up with [facility], MD for primary care needs. Follow up plan: Return in about 6 months (around 06/18/2016) for follow up with pre-visit labs.  [name], MD Phone: [phone]  Fax: [phone]   12/19/2015, 12:00 PM

## 2015-12-26 DIAGNOSIS — N183 Chronic kidney disease, stage 3 (moderate): Secondary | ICD-10-CM | POA: Diagnosis not present

## 2015-12-26 DIAGNOSIS — M1990 Unspecified osteoarthritis, unspecified site: Secondary | ICD-10-CM | POA: Diagnosis not present

## 2015-12-26 DIAGNOSIS — E039 Hypothyroidism, unspecified: Secondary | ICD-10-CM | POA: Diagnosis not present

## 2015-12-26 DIAGNOSIS — Z6823 Body mass index (BMI) 23.0-23.9, adult: Secondary | ICD-10-CM | POA: Diagnosis not present

## 2015-12-26 DIAGNOSIS — Z299 Encounter for prophylactic measures, unspecified: Secondary | ICD-10-CM | POA: Diagnosis not present

## 2015-12-26 DIAGNOSIS — I10 Essential (primary) hypertension: Secondary | ICD-10-CM | POA: Diagnosis not present

## 2016-01-21 ENCOUNTER — Other Ambulatory Visit: Payer: Self-pay | Admitting: "Endocrinology

## 2016-02-22 DIAGNOSIS — I10 Essential (primary) hypertension: Secondary | ICD-10-CM | POA: Diagnosis not present

## 2016-02-22 DIAGNOSIS — F329 Major depressive disorder, single episode, unspecified: Secondary | ICD-10-CM | POA: Diagnosis not present

## 2016-02-26 DIAGNOSIS — I10 Essential (primary) hypertension: Secondary | ICD-10-CM | POA: Diagnosis not present

## 2016-02-26 DIAGNOSIS — Z299 Encounter for prophylactic measures, unspecified: Secondary | ICD-10-CM | POA: Diagnosis not present

## 2016-02-26 DIAGNOSIS — Z6824 Body mass index (BMI) 24.0-24.9, adult: Secondary | ICD-10-CM | POA: Diagnosis not present

## 2016-02-26 DIAGNOSIS — N390 Urinary tract infection, site not specified: Secondary | ICD-10-CM | POA: Diagnosis not present

## 2016-02-26 DIAGNOSIS — R350 Frequency of micturition: Secondary | ICD-10-CM | POA: Diagnosis not present

## 2016-03-10 DIAGNOSIS — Z9011 Acquired absence of right breast and nipple: Secondary | ICD-10-CM | POA: Diagnosis not present

## 2016-03-10 DIAGNOSIS — Z1231 Encounter for screening mammogram for malignant neoplasm of breast: Secondary | ICD-10-CM | POA: Diagnosis not present

## 2016-04-04 ENCOUNTER — Encounter: Payer: Self-pay | Admitting: Internal Medicine

## 2016-05-01 DIAGNOSIS — G8929 Other chronic pain: Secondary | ICD-10-CM | POA: Diagnosis not present

## 2016-05-01 DIAGNOSIS — Z713 Dietary counseling and surveillance: Secondary | ICD-10-CM | POA: Diagnosis not present

## 2016-05-01 DIAGNOSIS — Z789 Other specified health status: Secondary | ICD-10-CM | POA: Diagnosis not present

## 2016-05-01 DIAGNOSIS — I10 Essential (primary) hypertension: Secondary | ICD-10-CM | POA: Diagnosis not present

## 2016-05-01 DIAGNOSIS — M25562 Pain in left knee: Secondary | ICD-10-CM | POA: Diagnosis not present

## 2016-05-01 DIAGNOSIS — Z299 Encounter for prophylactic measures, unspecified: Secondary | ICD-10-CM | POA: Diagnosis not present

## 2016-05-01 DIAGNOSIS — E039 Hypothyroidism, unspecified: Secondary | ICD-10-CM | POA: Diagnosis not present

## 2016-05-01 DIAGNOSIS — N183 Chronic kidney disease, stage 3 (moderate): Secondary | ICD-10-CM | POA: Diagnosis not present

## 2016-05-01 DIAGNOSIS — Z6825 Body mass index (BMI) 25.0-25.9, adult: Secondary | ICD-10-CM | POA: Diagnosis not present

## 2016-05-22 ENCOUNTER — Other Ambulatory Visit: Payer: Self-pay | Admitting: "Endocrinology

## 2016-06-04 DIAGNOSIS — M1712 Unilateral primary osteoarthritis, left knee: Secondary | ICD-10-CM | POA: Diagnosis not present

## 2016-06-10 ENCOUNTER — Other Ambulatory Visit: Payer: Self-pay | Admitting: "Endocrinology

## 2016-06-10 DIAGNOSIS — E038 Other specified hypothyroidism: Secondary | ICD-10-CM | POA: Diagnosis not present

## 2016-06-10 DIAGNOSIS — E063 Autoimmune thyroiditis: Secondary | ICD-10-CM | POA: Diagnosis not present

## 2016-06-11 DIAGNOSIS — M1712 Unilateral primary osteoarthritis, left knee: Secondary | ICD-10-CM | POA: Diagnosis not present

## 2016-06-11 LAB — TSH: TSH: 1.85 m[IU]/L

## 2016-06-11 LAB — T4, FREE: FREE T4: 1.5 ng/dL (ref 0.8–1.8)

## 2016-06-18 ENCOUNTER — Ambulatory Visit (INDEPENDENT_AMBULATORY_CARE_PROVIDER_SITE_OTHER): Payer: Medicare Other | Admitting: "Endocrinology

## 2016-06-18 ENCOUNTER — Encounter: Payer: Self-pay | Admitting: "Endocrinology

## 2016-06-18 VITALS — BP 128/74 | HR 82 | Ht 67.0 in | Wt 162.0 lb

## 2016-06-18 DIAGNOSIS — E063 Autoimmune thyroiditis: Secondary | ICD-10-CM | POA: Diagnosis not present

## 2016-06-18 DIAGNOSIS — E038 Other specified hypothyroidism: Secondary | ICD-10-CM | POA: Diagnosis not present

## 2016-06-18 MED ORDER — SYNTHROID 112 MCG PO TABS: ORAL_TABLET | ORAL | 12 refills | Status: DC

## 2016-06-18 NOTE — Progress Notes (Signed)
Subjective:    Patient ID: Lori Dorsey, female    DOB: 04/24/1945, PCP Monico Blitz, MD   Past Medical History:  Diagnosis Date  . Cancer ([place])   . Hypertension   . Thyroid disease    Past Surgical History:  Procedure Laterality Date  . ABDOMINAL HYSTERECTOMY    . BREAST SURGERY    . CHOLECYSTECTOMY     Jan. 10 th 2017  . MASTECTOMY     right   Social History   Social History  . Marital status: Married    Spouse name: N/A  . Number of children: N/A  . Years of education: N/A   Social History Main Topics  . Smoking status: Never Smoker  . Smokeless tobacco: Never Used  . Alcohol use No  . Drug use: No  . Sexual activity: Not Asked   Other Topics Concern  . None   Social History Narrative  . None   Outpatient Encounter Prescriptions as of 06/18/2016  Medication Sig  . atenolol (TENORMIN) 25 MG tablet Take 25 mg by mouth 2 (two) times daily.  . Cholecalciferol (VITAMIN D) 2000 units CAPS Take by mouth.  . folic acid (FOLVITE) 782 MCG tablet Take 800 mcg by mouth daily.  . Magnesium 250 MG TABS Take by mouth 2 (two) times daily.  Marland Kitchen pyridOXINE (VITAMIN B-6) 100 MG tablet Take 100 mg by mouth daily.  Marland Kitchen SYNTHROID 112 MCG tablet TAKE ONE TABLET BY MOUTH ONCE DAILY BEFORE  BREAKFAST  . [DISCONTINUED] SYNTHROID 112 MCG tablet TAKE ONE TABLET BY MOUTH ONCE DAILY BEFORE  BREAKFAST   No facility-administered encounter medications on file as of 06/18/2016.    ALLERGIES: No Known Allergies VACCINATION STATUS:  There is no immunization history on file for this patient.  HPI  71 year old female patient with the medical history as above. She is Here to follow-up for long-standing hypothyroidism.   She has taken various doses of thyroid hormone over the last 30 years. She is currently on Synthroid 112 g by mouth every morning. Her most recent TFTs still suggest that she is appropriately replaced. She has family history of thyroid dysfunction in one of her  siblings and one of her daughters. -She denies any family history of thyroid cancer. She reports compliance to her medications.  Review of Systems  Constitutional: She has a stable weight,  + fatigue, no subjective hyperthermia/hypothermia Eyes: no blurry vision, no xerophthalmia ENT: no sore throat, no nodules palpated in throat, no dysphagia/odynophagia, no hoarseness Cardiovascular: no CP/SOB/palpitations/leg swelling Respiratory: no cough/SOB Gastrointestinal: no N/V/D/C Musculoskeletal: no muscle/joint aches Skin: no rashes Neurological: no tremors/numbness/tingling/dizziness Psychiatric: no depression/anxiety  Objective:    BP 128/74   Pulse 82   Ht 5\' 7"  (1.702 m)   Wt 162 lb (73.5 kg)   BMI 25.37 kg/m   Wt Readings from Last 3 Encounters:  06/18/16 162 lb (73.5 kg)  12/19/15 158 lb (71.7 kg)  08/20/15 155 lb (70.3 kg)    Physical Exam  Constitutional:  in NAD Eyes: PERRLA, EOMI, no exophthalmos ENT: moist mucous membranes, no thyromegaly, no cervical lymphadenopathy Cardiovascular: RRR, No MRG Respiratory: CTA B Gastrointestinal: abdomen soft, NT, ND, BS+ Musculoskeletal: no deformities, strength intact in all 4 Skin: moist, warm, no rashes Neurological: no tremor with outstretched hands, DTR normal in all 4   Recent Results (from the past 2160 hour(s))  TSH     Status: None   Collection Time: 06/10/16  2:41 PM  Result Value Ref  Range   TSH 1.85 mIU/L    Comment:   Reference Range   > or = 20 Years  0.40-4.50   Pregnancy Range First trimester  0.26-2.66 Second trimester 0.55-2.73 Third trimester  0.43-2.91     T4, free     Status: None   Collection Time: 06/10/16  2:41 PM  Result Value Ref Range   Free T4 1.5 0.8 - 1.8 ng/dL      Assessment & Plan:   1. Hypothyroidism -The cause for her hypothyroidism is Hashimoto's thyroiditis. - Her thyroid function tests are consistent with appropriate replacement. -I will continue Synthroid   112 g  by mouth every morning.  - We discussed about correct intake of levothyroxine, at fasting, with water, separated by at least 30 minutes from breakfast, and separated by more than 4 hours from calcium, iron, multivitamins, acid reflux medications (PPIs). -Patient is made aware of the fact that thyroid hormone replacement is needed for life, dose to be adjusted by periodic monitoring of thyroid function tests.  -Clinically she has no goiter, hence no need for imaging studies at this time.   - I advised patient to maintain close follow up with [facility], MD for primary care needs. Follow up plan: Return in about 1 year (around 06/18/2017) for follow up with pre-visit labs.  [name], MD Phone: [phone]  Fax: [phone]   06/18/2016, 1:13 PM

## 2016-06-19 DIAGNOSIS — M1712 Unilateral primary osteoarthritis, left knee: Secondary | ICD-10-CM | POA: Diagnosis not present

## 2016-06-25 DIAGNOSIS — I10 Essential (primary) hypertension: Secondary | ICD-10-CM | POA: Diagnosis not present

## 2016-06-25 DIAGNOSIS — Z1211 Encounter for screening for malignant neoplasm of colon: Secondary | ICD-10-CM | POA: Diagnosis not present

## 2016-06-25 DIAGNOSIS — Z0000 Encounter for general adult medical examination without abnormal findings: Secondary | ICD-10-CM | POA: Diagnosis not present

## 2016-06-25 DIAGNOSIS — Z1389 Encounter for screening for other disorder: Secondary | ICD-10-CM | POA: Diagnosis not present

## 2016-06-25 DIAGNOSIS — E7800 Pure hypercholesterolemia, unspecified: Secondary | ICD-10-CM | POA: Diagnosis not present

## 2016-06-25 DIAGNOSIS — Z6825 Body mass index (BMI) 25.0-25.9, adult: Secondary | ICD-10-CM | POA: Diagnosis not present

## 2016-06-25 DIAGNOSIS — R5383 Other fatigue: Secondary | ICD-10-CM | POA: Diagnosis not present

## 2016-06-25 DIAGNOSIS — Z299 Encounter for prophylactic measures, unspecified: Secondary | ICD-10-CM | POA: Diagnosis not present

## 2016-06-25 DIAGNOSIS — N183 Chronic kidney disease, stage 3 (moderate): Secondary | ICD-10-CM | POA: Diagnosis not present

## 2016-06-25 DIAGNOSIS — Z79899 Other long term (current) drug therapy: Secondary | ICD-10-CM | POA: Diagnosis not present

## 2016-06-25 DIAGNOSIS — Z7189 Other specified counseling: Secondary | ICD-10-CM | POA: Diagnosis not present

## 2016-06-25 DIAGNOSIS — E559 Vitamin D deficiency, unspecified: Secondary | ICD-10-CM | POA: Diagnosis not present

## 2016-06-30 DIAGNOSIS — M1712 Unilateral primary osteoarthritis, left knee: Secondary | ICD-10-CM | POA: Diagnosis not present

## 2016-07-03 DIAGNOSIS — I10 Essential (primary) hypertension: Secondary | ICD-10-CM | POA: Diagnosis not present

## 2016-07-03 DIAGNOSIS — F329 Major depressive disorder, single episode, unspecified: Secondary | ICD-10-CM | POA: Diagnosis not present

## 2016-07-08 DIAGNOSIS — E2839 Other primary ovarian failure: Secondary | ICD-10-CM | POA: Diagnosis not present

## 2016-07-25 DIAGNOSIS — T7840XA Allergy, unspecified, initial encounter: Secondary | ICD-10-CM | POA: Diagnosis not present

## 2016-07-25 DIAGNOSIS — Z713 Dietary counseling and surveillance: Secondary | ICD-10-CM | POA: Diagnosis not present

## 2016-07-25 DIAGNOSIS — R21 Rash and other nonspecific skin eruption: Secondary | ICD-10-CM | POA: Diagnosis not present

## 2016-07-25 DIAGNOSIS — Z6825 Body mass index (BMI) 25.0-25.9, adult: Secondary | ICD-10-CM | POA: Diagnosis not present

## 2016-07-25 DIAGNOSIS — Z299 Encounter for prophylactic measures, unspecified: Secondary | ICD-10-CM | POA: Diagnosis not present

## 2016-08-18 DIAGNOSIS — M1712 Unilateral primary osteoarthritis, left knee: Secondary | ICD-10-CM | POA: Diagnosis not present

## 2016-10-02 DIAGNOSIS — F329 Major depressive disorder, single episode, unspecified: Secondary | ICD-10-CM | POA: Diagnosis not present

## 2016-10-02 DIAGNOSIS — I10 Essential (primary) hypertension: Secondary | ICD-10-CM | POA: Diagnosis not present

## 2016-11-27 DIAGNOSIS — Z23 Encounter for immunization: Secondary | ICD-10-CM | POA: Diagnosis not present

## 2016-12-25 DIAGNOSIS — N183 Chronic kidney disease, stage 3 (moderate): Secondary | ICD-10-CM | POA: Diagnosis not present

## 2016-12-25 DIAGNOSIS — Z299 Encounter for prophylactic measures, unspecified: Secondary | ICD-10-CM | POA: Diagnosis not present

## 2016-12-25 DIAGNOSIS — F419 Anxiety disorder, unspecified: Secondary | ICD-10-CM | POA: Diagnosis not present

## 2016-12-25 DIAGNOSIS — E039 Hypothyroidism, unspecified: Secondary | ICD-10-CM | POA: Diagnosis not present

## 2016-12-25 DIAGNOSIS — G8929 Other chronic pain: Secondary | ICD-10-CM | POA: Diagnosis not present

## 2016-12-25 DIAGNOSIS — M25562 Pain in left knee: Secondary | ICD-10-CM | POA: Diagnosis not present

## 2016-12-25 DIAGNOSIS — I10 Essential (primary) hypertension: Secondary | ICD-10-CM | POA: Diagnosis not present

## 2016-12-25 DIAGNOSIS — E7800 Pure hypercholesterolemia, unspecified: Secondary | ICD-10-CM | POA: Diagnosis not present

## 2016-12-25 DIAGNOSIS — Z6825 Body mass index (BMI) 25.0-25.9, adult: Secondary | ICD-10-CM | POA: Diagnosis not present

## 2017-02-02 DIAGNOSIS — I10 Essential (primary) hypertension: Secondary | ICD-10-CM | POA: Diagnosis not present

## 2017-02-02 DIAGNOSIS — Q149 Congenital malformation of posterior segment of eye, unspecified: Secondary | ICD-10-CM | POA: Diagnosis not present

## 2017-02-02 DIAGNOSIS — H5213 Myopia, bilateral: Secondary | ICD-10-CM | POA: Diagnosis not present

## 2017-02-02 DIAGNOSIS — F329 Major depressive disorder, single episode, unspecified: Secondary | ICD-10-CM | POA: Diagnosis not present

## 2017-02-02 DIAGNOSIS — H524 Presbyopia: Secondary | ICD-10-CM | POA: Diagnosis not present

## 2017-02-02 DIAGNOSIS — H52223 Regular astigmatism, bilateral: Secondary | ICD-10-CM | POA: Diagnosis not present

## 2017-03-23 DIAGNOSIS — Z1231 Encounter for screening mammogram for malignant neoplasm of breast: Secondary | ICD-10-CM | POA: Diagnosis not present

## 2017-05-26 DIAGNOSIS — F329 Major depressive disorder, single episode, unspecified: Secondary | ICD-10-CM | POA: Diagnosis not present

## 2017-05-26 DIAGNOSIS — I10 Essential (primary) hypertension: Secondary | ICD-10-CM | POA: Diagnosis not present

## 2017-06-09 ENCOUNTER — Other Ambulatory Visit: Payer: Self-pay | Admitting: "Endocrinology

## 2017-06-09 DIAGNOSIS — E039 Hypothyroidism, unspecified: Secondary | ICD-10-CM

## 2017-06-09 LAB — TSH: TSH: 2.25 mIU/L (ref 0.40–4.50)

## 2017-06-09 LAB — T4, FREE: FREE T4: 1.5 ng/dL (ref 0.8–1.8)

## 2017-06-10 DIAGNOSIS — M1712 Unilateral primary osteoarthritis, left knee: Secondary | ICD-10-CM | POA: Diagnosis not present

## 2017-06-17 DIAGNOSIS — M1712 Unilateral primary osteoarthritis, left knee: Secondary | ICD-10-CM | POA: Diagnosis not present

## 2017-06-18 ENCOUNTER — Encounter: Payer: Self-pay | Admitting: "Endocrinology

## 2017-06-18 ENCOUNTER — Ambulatory Visit (INDEPENDENT_AMBULATORY_CARE_PROVIDER_SITE_OTHER): Payer: Medicare Other | Admitting: "Endocrinology

## 2017-06-18 VITALS — BP 134/83 | HR 71 | Wt 164.0 lb

## 2017-06-18 DIAGNOSIS — E038 Other specified hypothyroidism: Secondary | ICD-10-CM

## 2017-06-18 DIAGNOSIS — E063 Autoimmune thyroiditis: Secondary | ICD-10-CM

## 2017-06-18 MED ORDER — SYNTHROID 112 MCG PO TABS: ORAL_TABLET | ORAL | 4 refills | Status: DC

## 2017-06-18 NOTE — Progress Notes (Signed)
Subjective:    Patient ID: Lori Dorsey, female    DOB: 01/30/1946, PCP Monico Blitz, MD   Past Medical History:  Diagnosis Date  . Cancer ([place])   . Hypertension   . Thyroid disease    Past Surgical History:  Procedure Laterality Date  . ABDOMINAL HYSTERECTOMY    . BREAST SURGERY    . CHOLECYSTECTOMY     Jan. 10 th 2017  . MASTECTOMY     right   Social History   Socioeconomic History  . Marital status: Married    Spouse name: Not on file  . Number of children: Not on file  . Years of education: Not on file  . Highest education level: Not on file  Occupational History  . Not on file  Social Needs  . Financial resource strain: Not on file  . Food insecurity:    Worry: Not on file    Inability: Not on file  . Transportation needs:    Medical: Not on file    Non-medical: Not on file  Tobacco Use  . Smoking status: Never Smoker  . Smokeless tobacco: Never Used  Substance and Sexual Activity  . Alcohol use: No  . Drug use: No  . Sexual activity: Not on file  Lifestyle  . Physical activity:    Days per week: Not on file    Minutes per session: Not on file  . Stress: Not on file  Relationships  . Social connections:    Talks on phone: Not on file    Gets together: Not on file    Attends religious service: Not on file    Active member of club or organization: Not on file    Attends meetings of clubs or organizations: Not on file    Relationship status: Not on file  Other Topics Concern  . Not on file  Social History Narrative  . Not on file   Outpatient Encounter Medications as of 06/18/2017  Medication Sig  . atenolol (TENORMIN) 25 MG tablet Take 25 mg by mouth 2 (two) times daily.  . Cholecalciferol (VITAMIN D) 2000 units CAPS Take by mouth.  . folic acid (FOLVITE) 244 MCG tablet Take 800 mcg by mouth daily.  . Magnesium 250 MG TABS Take by mouth 2 (two) times daily.  Marland Kitchen pyridOXINE (VITAMIN B-6) 100 MG tablet Take 100 mg by mouth daily.  Marland Kitchen  SYNTHROID 112 MCG tablet TAKE ONE TABLET BY MOUTH ONCE DAILY BEFORE  BREAKFAST  . [DISCONTINUED] SYNTHROID 112 MCG tablet TAKE ONE TABLET BY MOUTH ONCE DAILY BEFORE  BREAKFAST   No facility-administered encounter medications on file as of 06/18/2017.    ALLERGIES: No Known Allergies VACCINATION STATUS:  There is no immunization history on file for this patient.  HPI  72 year old female patient with the medical history as above. She is Here to follow-up for long-standing hypothyroidism.   She has taken various doses of thyroid hormone over the last 30 years. She is currently on Synthroid 112 g by mouth every morning.  Her most recent thyroid function tests are consistent with appropriate replacement.   -He denies any new complaints today. She has family history of thyroid dysfunction in one of her siblings and one of her daughters. -She denies any family history of thyroid cancer. She reports compliance to her medications.  Review of Systems  Constitutional: + Stable body weight, - fatigue, no subjective hyperthermia/hypothermia Eyes: no blurry vision, no xerophthalmia ENT: no sore throat, no nodules  palpated in throat, no dysphagia/odynophagia, no hoarseness Cardiovascular: Chest pain, no shortness of breath. Musculoskeletal: no muscle/joint aches Skin: no rashes Neurological: no tremors/numbness/tingling/dizziness Psychiatric: no depression/anxiety  Objective:    BP 134/83   Pulse 71   Wt 164 lb (74.4 kg)   BMI 25.69 kg/m   Wt Readings from Last 3 Encounters:  06/18/17 164 lb (74.4 kg)  06/18/16 162 lb (73.5 kg)  12/19/15 158 lb (71.7 kg)    Physical Exam  Constitutional: Stable state of mind, not in acute distress, alert and oriented x3.  Eyes: PERRLA, EOMI, no exophthalmos ENT: moist mucous membranes, no thyromegaly, no cervical lymphadenopathy Musculoskeletal: no deformities, strength intact in all 4 Skin: moist, warm, no rashes Neurological: no tremor with  outstretched hands, DTR normal in all 4   Recent Results (from the past 2160 hour(s))  T4, Free     Status: None   Collection Time: 06/09/17  1:38 PM  Result Value Ref Range   Free T4 1.5 0.8 - 1.8 ng/dL  TSH     Status: None   Collection Time: 06/09/17  1:38 PM  Result Value Ref Range   TSH 2.25 0.40 - 4.50 mIU/L      Assessment & Plan:   1. Hypothyroidism -The cause for her hypothyroidism is Hashimoto's thyroiditis. -Her thyroid function tests are consistent with appropriate replacement.  -I advised her to continue Synthroid 112 mcg p.o. every morning.     - We discussed about correct intake of levothyroxine, at fasting, with water, separated by at least 30 minutes from breakfast, and separated by more than 4 hours from calcium, iron, multivitamins, acid reflux medications (PPIs). -Patient is made aware of the fact that thyroid hormone replacement is needed for life, dose to be adjusted by periodic monitoring of thyroid function tests.  -Clinically she has no goiter, hence no need for imaging studies at this time.   - I advised patient to maintain close follow up with Monico Blitz, MD for primary care needs.  Follow up plan: Return in about 1 year (around 06/19/2018) for follow up with pre-visit labs.  [name], MD Phone: [phone]  Fax: [phone]  -  This note was partially dictated with voice recognition software. Similar sounding words can be transcribed inadequately or may not  be corrected upon review.  06/18/2017, 1:40 PM

## 2017-06-24 DIAGNOSIS — M1712 Unilateral primary osteoarthritis, left knee: Secondary | ICD-10-CM | POA: Diagnosis not present

## 2017-06-30 DIAGNOSIS — N183 Chronic kidney disease, stage 3 (moderate): Secondary | ICD-10-CM | POA: Diagnosis not present

## 2017-06-30 DIAGNOSIS — Z6825 Body mass index (BMI) 25.0-25.9, adult: Secondary | ICD-10-CM | POA: Diagnosis not present

## 2017-06-30 DIAGNOSIS — R5383 Other fatigue: Secondary | ICD-10-CM | POA: Diagnosis not present

## 2017-06-30 DIAGNOSIS — E7800 Pure hypercholesterolemia, unspecified: Secondary | ICD-10-CM | POA: Diagnosis not present

## 2017-06-30 DIAGNOSIS — Z1339 Encounter for screening examination for other mental health and behavioral disorders: Secondary | ICD-10-CM | POA: Diagnosis not present

## 2017-06-30 DIAGNOSIS — Z0000 Encounter for general adult medical examination without abnormal findings: Secondary | ICD-10-CM | POA: Diagnosis not present

## 2017-06-30 DIAGNOSIS — Z79899 Other long term (current) drug therapy: Secondary | ICD-10-CM | POA: Diagnosis not present

## 2017-06-30 DIAGNOSIS — Z1211 Encounter for screening for malignant neoplasm of colon: Secondary | ICD-10-CM | POA: Diagnosis not present

## 2017-06-30 DIAGNOSIS — E559 Vitamin D deficiency, unspecified: Secondary | ICD-10-CM | POA: Diagnosis not present

## 2017-06-30 DIAGNOSIS — Z299 Encounter for prophylactic measures, unspecified: Secondary | ICD-10-CM | POA: Diagnosis not present

## 2017-06-30 DIAGNOSIS — Z1331 Encounter for screening for depression: Secondary | ICD-10-CM | POA: Diagnosis not present

## 2017-06-30 DIAGNOSIS — I10 Essential (primary) hypertension: Secondary | ICD-10-CM | POA: Diagnosis not present

## 2017-06-30 DIAGNOSIS — Z7189 Other specified counseling: Secondary | ICD-10-CM | POA: Diagnosis not present

## 2017-06-30 DIAGNOSIS — L299 Pruritus, unspecified: Secondary | ICD-10-CM | POA: Diagnosis not present

## 2017-07-21 DIAGNOSIS — F329 Major depressive disorder, single episode, unspecified: Secondary | ICD-10-CM | POA: Diagnosis not present

## 2017-07-21 DIAGNOSIS — I10 Essential (primary) hypertension: Secondary | ICD-10-CM | POA: Diagnosis not present

## 2017-10-05 DIAGNOSIS — I10 Essential (primary) hypertension: Secondary | ICD-10-CM | POA: Diagnosis not present

## 2017-10-05 DIAGNOSIS — F329 Major depressive disorder, single episode, unspecified: Secondary | ICD-10-CM | POA: Diagnosis not present

## 2017-12-08 DIAGNOSIS — Z23 Encounter for immunization: Secondary | ICD-10-CM | POA: Diagnosis not present

## 2017-12-31 DIAGNOSIS — I10 Essential (primary) hypertension: Secondary | ICD-10-CM | POA: Diagnosis not present

## 2017-12-31 DIAGNOSIS — Z6826 Body mass index (BMI) 26.0-26.9, adult: Secondary | ICD-10-CM | POA: Diagnosis not present

## 2017-12-31 DIAGNOSIS — Z299 Encounter for prophylactic measures, unspecified: Secondary | ICD-10-CM | POA: Diagnosis not present

## 2017-12-31 DIAGNOSIS — N183 Chronic kidney disease, stage 3 (moderate): Secondary | ICD-10-CM | POA: Diagnosis not present

## 2017-12-31 DIAGNOSIS — E039 Hypothyroidism, unspecified: Secondary | ICD-10-CM | POA: Diagnosis not present

## 2018-01-25 DIAGNOSIS — I10 Essential (primary) hypertension: Secondary | ICD-10-CM | POA: Diagnosis not present

## 2018-01-25 DIAGNOSIS — F329 Major depressive disorder, single episode, unspecified: Secondary | ICD-10-CM | POA: Diagnosis not present

## 2018-02-11 DIAGNOSIS — Z6826 Body mass index (BMI) 26.0-26.9, adult: Secondary | ICD-10-CM | POA: Diagnosis not present

## 2018-02-11 DIAGNOSIS — I10 Essential (primary) hypertension: Secondary | ICD-10-CM | POA: Diagnosis not present

## 2018-02-11 DIAGNOSIS — F419 Anxiety disorder, unspecified: Secondary | ICD-10-CM | POA: Diagnosis not present

## 2018-02-11 DIAGNOSIS — Z299 Encounter for prophylactic measures, unspecified: Secondary | ICD-10-CM | POA: Diagnosis not present

## 2018-02-11 DIAGNOSIS — K219 Gastro-esophageal reflux disease without esophagitis: Secondary | ICD-10-CM | POA: Diagnosis not present

## 2018-02-21 DIAGNOSIS — Z853 Personal history of malignant neoplasm of breast: Secondary | ICD-10-CM | POA: Diagnosis not present

## 2018-02-21 DIAGNOSIS — E039 Hypothyroidism, unspecified: Secondary | ICD-10-CM | POA: Diagnosis not present

## 2018-02-21 DIAGNOSIS — Z87891 Personal history of nicotine dependence: Secondary | ICD-10-CM | POA: Diagnosis not present

## 2018-02-21 DIAGNOSIS — R079 Chest pain, unspecified: Secondary | ICD-10-CM | POA: Diagnosis not present

## 2018-02-21 DIAGNOSIS — R0789 Other chest pain: Secondary | ICD-10-CM | POA: Diagnosis not present

## 2018-02-21 DIAGNOSIS — I10 Essential (primary) hypertension: Secondary | ICD-10-CM | POA: Diagnosis not present

## 2018-02-26 DIAGNOSIS — F419 Anxiety disorder, unspecified: Secondary | ICD-10-CM | POA: Diagnosis not present

## 2018-02-26 DIAGNOSIS — I10 Essential (primary) hypertension: Secondary | ICD-10-CM | POA: Diagnosis not present

## 2018-02-26 DIAGNOSIS — Z6826 Body mass index (BMI) 26.0-26.9, adult: Secondary | ICD-10-CM | POA: Diagnosis not present

## 2018-02-26 DIAGNOSIS — K219 Gastro-esophageal reflux disease without esophagitis: Secondary | ICD-10-CM | POA: Diagnosis not present

## 2018-02-26 DIAGNOSIS — Z299 Encounter for prophylactic measures, unspecified: Secondary | ICD-10-CM | POA: Diagnosis not present

## 2018-03-29 DIAGNOSIS — K219 Gastro-esophageal reflux disease without esophagitis: Secondary | ICD-10-CM | POA: Diagnosis not present

## 2018-03-29 DIAGNOSIS — Z789 Other specified health status: Secondary | ICD-10-CM | POA: Diagnosis not present

## 2018-03-29 DIAGNOSIS — I10 Essential (primary) hypertension: Secondary | ICD-10-CM | POA: Diagnosis not present

## 2018-03-29 DIAGNOSIS — Z299 Encounter for prophylactic measures, unspecified: Secondary | ICD-10-CM | POA: Diagnosis not present

## 2018-03-29 DIAGNOSIS — Z6826 Body mass index (BMI) 26.0-26.9, adult: Secondary | ICD-10-CM | POA: Diagnosis not present

## 2018-04-09 DIAGNOSIS — I10 Essential (primary) hypertension: Secondary | ICD-10-CM | POA: Diagnosis not present

## 2018-04-09 DIAGNOSIS — F329 Major depressive disorder, single episode, unspecified: Secondary | ICD-10-CM | POA: Diagnosis not present

## 2018-04-15 DIAGNOSIS — Z1231 Encounter for screening mammogram for malignant neoplasm of breast: Secondary | ICD-10-CM | POA: Diagnosis not present

## 2018-06-09 DIAGNOSIS — F329 Major depressive disorder, single episode, unspecified: Secondary | ICD-10-CM | POA: Diagnosis not present

## 2018-06-09 DIAGNOSIS — I10 Essential (primary) hypertension: Secondary | ICD-10-CM | POA: Diagnosis not present

## 2018-06-16 DIAGNOSIS — E063 Autoimmune thyroiditis: Secondary | ICD-10-CM | POA: Diagnosis not present

## 2018-06-16 DIAGNOSIS — E038 Other specified hypothyroidism: Secondary | ICD-10-CM | POA: Diagnosis not present

## 2018-06-17 LAB — T4, FREE: Free T4: 1.6 ng/dL (ref 0.8–1.8)

## 2018-06-17 LAB — TSH: TSH: 5.28 mIU/L — ABNORMAL HIGH (ref 0.40–4.50)

## 2018-06-21 ENCOUNTER — Ambulatory Visit: Payer: Medicare Other | Admitting: "Endocrinology

## 2018-06-23 ENCOUNTER — Encounter: Payer: Self-pay | Admitting: "Endocrinology

## 2018-06-23 ENCOUNTER — Other Ambulatory Visit: Payer: Self-pay

## 2018-06-23 ENCOUNTER — Ambulatory Visit (INDEPENDENT_AMBULATORY_CARE_PROVIDER_SITE_OTHER): Payer: Medicare Other | Admitting: "Endocrinology

## 2018-06-23 DIAGNOSIS — E063 Autoimmune thyroiditis: Secondary | ICD-10-CM | POA: Diagnosis not present

## 2018-06-23 DIAGNOSIS — E038 Other specified hypothyroidism: Secondary | ICD-10-CM | POA: Diagnosis not present

## 2018-06-23 MED ORDER — SYNTHROID 112 MCG PO TABS: ORAL_TABLET | ORAL | 1 refills | Status: DC

## 2018-06-23 NOTE — Progress Notes (Signed)
Endocrinology Telehealth Visit Follow up Note -During COVID -19 Pandemic   Subjective:    Patient ID: Lori Dorsey, female    DOB: 13-Aug-1945, PCP Lori Blitz, MD   Past Medical History:  Diagnosis Date  . Cancer (Lori Dorsey)   . Hypertension   . Thyroid disease    Past Surgical History:  Procedure Laterality Date  . ABDOMINAL HYSTERECTOMY    . BREAST SURGERY    . CHOLECYSTECTOMY     Jan. 10 th 2017  . MASTECTOMY     right   Social History   Socioeconomic History  . Marital status: Married    Spouse name: Not on file  . Number of children: Not on file  . Years of education: Not on file  . Highest education level: Not on file  Occupational History  . Not on file  Social Needs  . Financial resource strain: Not on file  . Food insecurity:    Worry: Not on file    Inability: Not on file  . Transportation needs:    Medical: Not on file    Non-medical: Not on file  Tobacco Use  . Smoking status: Never Smoker  . Smokeless tobacco: Never Used  Substance and Sexual Activity  . Alcohol use: No  . Drug use: No  . Sexual activity: Not on file  Lifestyle  . Physical activity:    Days per week: Not on file    Minutes per session: Not on file  . Stress: Not on file  Relationships  . Social connections:    Talks on phone: Not on file    Gets together: Not on file    Attends religious service: Not on file    Active member of club or organization: Not on file    Attends meetings of clubs or organizations: Not on file    Relationship status: Not on file  Other Topics Concern  . Not on file  Social History Narrative  . Not on file   Outpatient Encounter Medications as of 06/23/2018  Medication Sig  . atenolol (TENORMIN) 25 MG tablet Take 25 mg by mouth 2 (two) times daily.  . Cholecalciferol (VITAMIN D) 2000 units CAPS Take by mouth.  . folic acid (FOLVITE) 209 MCG tablet Take 800 mcg by mouth daily.  . Magnesium 250 MG TABS Take by mouth 2 (two) times daily.  Marland Kitchen  pyridOXINE (VITAMIN B-6) 100 MG tablet Take 100 mg by mouth daily.  Marland Kitchen SYNTHROID 112 MCG tablet TAKE ONE TABLET BY MOUTH ONCE DAILY BEFORE  BREAKFAST  . [DISCONTINUED] SYNTHROID 112 MCG tablet TAKE ONE TABLET BY MOUTH ONCE DAILY BEFORE  BREAKFAST   No facility-administered encounter medications on file as of 06/23/2018.    ALLERGIES: No Known Allergies VACCINATION STATUS:  There is no immunization history on file for this patient.  HPI  73 year old female patient with the medical history as above. She is being engaged in telehealth for follow-up of longstanding hypothyroidism.     She has taken various doses of thyroid hormone over the last 30 years. She is currently on Synthroid 112 g by mouth every morning.  Her most recent thyroid function tests are consistent with appropriate replacement.   -He denies any new complaints today.  He has a steady body weight.  She denies palpitations, heat intolerance, tremors. She has family history of thyroid dysfunction in one of her siblings and one of her daughters. -She denies any family history of thyroid cancer. She  reports compliance to her medications.  Review of Systems Limited as above.   Objective:    There were no vitals taken for this visit.  Wt Readings from Last 3 Encounters:  06/18/17 164 lb (74.4 kg)  06/18/16 162 lb (73.5 kg)  12/19/15 158 lb (71.7 kg)    Physical Exam   Recent Results (from the past 2160 hour(s))  TSH     Status: Abnormal   Collection Time: 06/16/18 10:12 AM  Result Value Ref Range   TSH 5.28 (H) 0.40 - 4.50 mIU/L  T4, free     Status: None   Collection Time: 06/16/18 10:12 AM  Result Value Ref Range   Free T4 1.6 0.8 - 1.8 ng/dL    Assessment & Plan:   1. Hypothyroidism -The cause for her hypothyroidism is Hashimoto's thyroiditis. -Her thyroid function tests are consistent with appropriate replacement.  -She is advised to continue Synthroid 112 mcg p.o. every morning.    - We discussed  about the correct intake of her thyroid hormone, on empty stomach at fasting, with water, separated by at least 30 minutes from breakfast and other medications,  and separated by more than 4 hours from calcium, iron, multivitamins, acid reflux medications (PPIs). -Patient is made aware of the fact that thyroid hormone replacement is needed for life, dose to be adjusted by periodic monitoring of thyroid function tests.    - I advised patient to maintain close follow up with Lori Blitz, MD for primary care needs. Time spent in this visit 15 minutes.  Lori Dorsey participated in the discussions, expressed understanding, and voiced agreement with the above plans.  All questions were answered to her satisfaction. she is encouraged to contact clinic should she have any questions or concerns prior to her return visit.   Follow up plan: Return in about 4 months (around 10/23/2018), or include her labs in her mail, for Follow up with Pre-visit Labs.  Lori Lloyd, MD Phone: [phone]  Fax: [phone]  -  This note was partially dictated with voice recognition software. Similar sounding words can be transcribed inadequately or may not  be corrected upon review.  06/23/2018, 1:22 PM

## 2018-07-05 DIAGNOSIS — E559 Vitamin D deficiency, unspecified: Secondary | ICD-10-CM | POA: Diagnosis not present

## 2018-07-05 DIAGNOSIS — Z1211 Encounter for screening for malignant neoplasm of colon: Secondary | ICD-10-CM | POA: Diagnosis not present

## 2018-07-05 DIAGNOSIS — Z0000 Encounter for general adult medical examination without abnormal findings: Secondary | ICD-10-CM | POA: Diagnosis not present

## 2018-07-05 DIAGNOSIS — Z1331 Encounter for screening for depression: Secondary | ICD-10-CM | POA: Diagnosis not present

## 2018-07-05 DIAGNOSIS — I10 Essential (primary) hypertension: Secondary | ICD-10-CM | POA: Diagnosis not present

## 2018-07-05 DIAGNOSIS — E7800 Pure hypercholesterolemia, unspecified: Secondary | ICD-10-CM | POA: Diagnosis not present

## 2018-07-05 DIAGNOSIS — Z299 Encounter for prophylactic measures, unspecified: Secondary | ICD-10-CM | POA: Diagnosis not present

## 2018-07-05 DIAGNOSIS — Z79899 Other long term (current) drug therapy: Secondary | ICD-10-CM | POA: Diagnosis not present

## 2018-07-05 DIAGNOSIS — Z7189 Other specified counseling: Secondary | ICD-10-CM | POA: Diagnosis not present

## 2018-07-05 DIAGNOSIS — Z1339 Encounter for screening examination for other mental health and behavioral disorders: Secondary | ICD-10-CM | POA: Diagnosis not present

## 2018-07-05 DIAGNOSIS — Z6825 Body mass index (BMI) 25.0-25.9, adult: Secondary | ICD-10-CM | POA: Diagnosis not present

## 2018-07-23 DIAGNOSIS — I10 Essential (primary) hypertension: Secondary | ICD-10-CM | POA: Diagnosis not present

## 2018-07-23 DIAGNOSIS — F329 Major depressive disorder, single episode, unspecified: Secondary | ICD-10-CM | POA: Diagnosis not present

## 2018-08-13 DIAGNOSIS — I10 Essential (primary) hypertension: Secondary | ICD-10-CM | POA: Diagnosis not present

## 2018-08-13 DIAGNOSIS — F329 Major depressive disorder, single episode, unspecified: Secondary | ICD-10-CM | POA: Diagnosis not present

## 2018-08-14 DIAGNOSIS — Q149 Congenital malformation of posterior segment of eye, unspecified: Secondary | ICD-10-CM | POA: Diagnosis not present

## 2018-08-20 DIAGNOSIS — E2839 Other primary ovarian failure: Secondary | ICD-10-CM | POA: Diagnosis not present

## 2018-09-09 DIAGNOSIS — F329 Major depressive disorder, single episode, unspecified: Secondary | ICD-10-CM | POA: Diagnosis not present

## 2018-09-09 DIAGNOSIS — I10 Essential (primary) hypertension: Secondary | ICD-10-CM | POA: Diagnosis not present

## 2018-10-19 DIAGNOSIS — E038 Other specified hypothyroidism: Secondary | ICD-10-CM | POA: Diagnosis not present

## 2018-10-19 DIAGNOSIS — E063 Autoimmune thyroiditis: Secondary | ICD-10-CM | POA: Diagnosis not present

## 2018-10-19 LAB — T4, FREE: Free T4: 1.6 ng/dL (ref 0.8–1.8)

## 2018-10-19 LAB — TSH: TSH: 2.01 mIU/L (ref 0.40–4.50)

## 2018-10-25 ENCOUNTER — Ambulatory Visit (INDEPENDENT_AMBULATORY_CARE_PROVIDER_SITE_OTHER): Payer: Medicare Other | Admitting: "Endocrinology

## 2018-10-25 ENCOUNTER — Other Ambulatory Visit: Payer: Self-pay

## 2018-10-25 ENCOUNTER — Encounter: Payer: Self-pay | Admitting: "Endocrinology

## 2018-10-25 DIAGNOSIS — E038 Other specified hypothyroidism: Secondary | ICD-10-CM | POA: Diagnosis not present

## 2018-10-25 DIAGNOSIS — E063 Autoimmune thyroiditis: Secondary | ICD-10-CM | POA: Diagnosis not present

## 2018-10-25 MED ORDER — SYNTHROID 112 MCG PO TABS: ORAL_TABLET | ORAL | 3 refills | Status: DC

## 2018-10-25 NOTE — Progress Notes (Signed)
10/25/2018                                Endocrinology Telehealth Visit Follow up Note -During COVID -19 Pandemic  I connected with Lori Dorsey on 10/25/2018   by telephone and verified that I am speaking with the correct person using two identifiers. Lori Dorsey, 07-27-1945. she has verbally consented to this visit. All issues noted in this document were discussed and addressed. The format was not optimal for physical exam.   Subjective:    Patient ID: Lori Dorsey, female    DOB: January 28, 1946, PCP Monico Blitz, MD   Past Medical History:  Diagnosis Date  . Cancer ([place])   . Hypertension   . Thyroid disease    Past Surgical History:  Procedure Laterality Date  . ABDOMINAL HYSTERECTOMY    . BREAST SURGERY    . CHOLECYSTECTOMY     Jan. 10 th 2017  . MASTECTOMY     right   Social History   Socioeconomic History  . Marital status: Married    Spouse name: Not on file  . Number of children: Not on file  . Years of education: Not on file  . Highest education level: Not on file  Occupational History  . Not on file  Social Needs  . Financial resource strain: Not on file  . Food insecurity    Worry: Not on file    Inability: Not on file  . Transportation needs    Medical: Not on file    Non-medical: Not on file  Tobacco Use  . Smoking status: Never Smoker  . Smokeless tobacco: Never Used  Substance and Sexual Activity  . Alcohol use: No  . Drug use: No  . Sexual activity: Not on file  Lifestyle  . Physical activity    Days per week: Not on file    Minutes per session: Not on file  . Stress: Not on file  Relationships  . Social Herbalist on phone: Not on file    Gets together: Not on file    Attends religious service: Not on file    Active member of club or organization: Not on file    Attends meetings of clubs or organizations: Not on file    Relationship status: Not on file  Other Topics Concern  . Not on file  Social History Narrative  .  Not on file   Outpatient Encounter Medications as of 10/25/2018  Medication Sig  . atenolol (TENORMIN) 25 MG tablet Take 25 mg by mouth 2 (two) times daily.  . Cholecalciferol (VITAMIN D) 2000 units CAPS Take by mouth.  . folic acid (FOLVITE) Q000111Q MCG tablet Take 800 mcg by mouth daily.  . Magnesium 250 MG TABS Take by mouth 2 (two) times daily.  Marland Kitchen pyridOXINE (VITAMIN B-6) 100 MG tablet Take 100 mg by mouth daily.  Marland Kitchen SYNTHROID 112 MCG tablet TAKE ONE TABLET BY MOUTH ONCE DAILY BEFORE  BREAKFAST  . [DISCONTINUED] SYNTHROID 112 MCG tablet TAKE ONE TABLET BY MOUTH ONCE DAILY BEFORE  BREAKFAST   No facility-administered encounter medications on file as of 10/25/2018.    ALLERGIES: No Known Allergies VACCINATION STATUS:  There is no immunization history on file for this patient.  HPI  73 year old female patient with the medical history as above. She is being engaged in telehealth for follow-up of longstanding hypothyroidism.  She has taken various doses of thyroid hormone over the last 30 years. She is currently on Synthroid 112 mcg p.o. daily before breakfast . Her most recent thyroid function tests are consistent with appropriate replacement.   -He denies any new complaints today.  He has a steady body weight.  She denies palpitations, heat intolerance, tremors. She has family history of thyroid dysfunction in one of her siblings and one of her daughters. -She denies any family history of thyroid cancer. She reports compliance to her medications.  Review of Systems Limited as above.   Objective:    There were no vitals taken for this visit.  Wt Readings from Last 3 Encounters:  06/18/17 164 lb (74.4 kg)  06/18/16 162 lb (73.5 kg)  12/19/15 158 lb (71.7 kg)    Physical Exam   Recent Results (from the past 2160 hour(s))  T4, free     Status: None   Collection Time: 10/19/18 10:21 AM  Result Value Ref Range   Free T4 1.6 0.8 - 1.8 ng/dL  TSH     Status: None   Collection  Time: 10/19/18 10:21 AM  Result Value Ref Range   TSH 2.01 0.40 - 4.50 mIU/L    Assessment & Plan:   1. Hypothyroidism -The cause for her hypothyroidism is Hashimoto's thyroiditis. -Her thyroid function tests are consistent with appropriate replacement.  -She is advised to continue Synthroid 112 mcg p.o. every morning.    - We discussed about the correct intake of her thyroid hormone, on empty stomach at fasting, with water, separated by at least 30 minutes from breakfast and other medications,  and separated by more than 4 hours from calcium, iron, multivitamins, acid reflux medications (PPIs). -Patient is made aware of the fact that thyroid hormone replacement is needed for life, dose to be adjusted by periodic monitoring of thyroid function tests.  - I advised patient to maintain close follow up with Monico Blitz, MD for primary care needs.   Time for this visit: 15 minutes. Lori Dorsey  participated in the discussions, expressed understanding, and voiced agreement with the above plans.  All questions were answered to her satisfaction. she is encouraged to contact clinic should she have any questions or concerns prior to her return visit.   Follow up plan: Return in about 1 year (around 10/25/2019) for Follow up with Pre-visit Labs.  [name], MD Phone: [phone]  Fax: [phone]  -  This note was partially dictated with voice recognition software. Similar sounding words can be transcribed inadequately or may not  be corrected upon review.  10/25/2018, 4:31 PM

## 2018-10-29 DIAGNOSIS — F329 Major depressive disorder, single episode, unspecified: Secondary | ICD-10-CM | POA: Diagnosis not present

## 2018-10-29 DIAGNOSIS — I10 Essential (primary) hypertension: Secondary | ICD-10-CM | POA: Diagnosis not present

## 2018-11-17 DIAGNOSIS — M1712 Unilateral primary osteoarthritis, left knee: Secondary | ICD-10-CM | POA: Diagnosis not present

## 2018-11-24 DIAGNOSIS — M1712 Unilateral primary osteoarthritis, left knee: Secondary | ICD-10-CM | POA: Diagnosis not present

## 2018-12-01 DIAGNOSIS — M1712 Unilateral primary osteoarthritis, left knee: Secondary | ICD-10-CM | POA: Diagnosis not present

## 2018-12-03 DIAGNOSIS — Z23 Encounter for immunization: Secondary | ICD-10-CM | POA: Diagnosis not present

## 2019-01-05 DIAGNOSIS — N183 Chronic kidney disease, stage 3 unspecified: Secondary | ICD-10-CM | POA: Diagnosis not present

## 2019-01-05 DIAGNOSIS — Z299 Encounter for prophylactic measures, unspecified: Secondary | ICD-10-CM | POA: Diagnosis not present

## 2019-01-05 DIAGNOSIS — E039 Hypothyroidism, unspecified: Secondary | ICD-10-CM | POA: Diagnosis not present

## 2019-01-05 DIAGNOSIS — Z6826 Body mass index (BMI) 26.0-26.9, adult: Secondary | ICD-10-CM | POA: Diagnosis not present

## 2019-01-05 DIAGNOSIS — I10 Essential (primary) hypertension: Secondary | ICD-10-CM | POA: Diagnosis not present

## 2019-02-23 DIAGNOSIS — I10 Essential (primary) hypertension: Secondary | ICD-10-CM | POA: Diagnosis not present

## 2019-02-28 DIAGNOSIS — F329 Major depressive disorder, single episode, unspecified: Secondary | ICD-10-CM | POA: Diagnosis not present

## 2019-02-28 DIAGNOSIS — I10 Essential (primary) hypertension: Secondary | ICD-10-CM | POA: Diagnosis not present

## 2019-03-09 DIAGNOSIS — Z789 Other specified health status: Secondary | ICD-10-CM | POA: Diagnosis not present

## 2019-03-09 DIAGNOSIS — R0981 Nasal congestion: Secondary | ICD-10-CM | POA: Diagnosis not present

## 2019-03-09 DIAGNOSIS — I10 Essential (primary) hypertension: Secondary | ICD-10-CM | POA: Diagnosis not present

## 2019-03-09 DIAGNOSIS — Z6826 Body mass index (BMI) 26.0-26.9, adult: Secondary | ICD-10-CM | POA: Diagnosis not present

## 2019-03-09 DIAGNOSIS — Z299 Encounter for prophylactic measures, unspecified: Secondary | ICD-10-CM | POA: Diagnosis not present

## 2019-03-29 DIAGNOSIS — I10 Essential (primary) hypertension: Secondary | ICD-10-CM | POA: Diagnosis not present

## 2019-04-28 DIAGNOSIS — Z23 Encounter for immunization: Secondary | ICD-10-CM | POA: Diagnosis not present

## 2019-04-28 DIAGNOSIS — I10 Essential (primary) hypertension: Secondary | ICD-10-CM | POA: Diagnosis not present

## 2019-05-27 DIAGNOSIS — Z23 Encounter for immunization: Secondary | ICD-10-CM | POA: Diagnosis not present

## 2019-05-31 DIAGNOSIS — I10 Essential (primary) hypertension: Secondary | ICD-10-CM | POA: Diagnosis not present

## 2019-07-01 DIAGNOSIS — I10 Essential (primary) hypertension: Secondary | ICD-10-CM | POA: Diagnosis not present

## 2019-07-06 DIAGNOSIS — Z1331 Encounter for screening for depression: Secondary | ICD-10-CM | POA: Diagnosis not present

## 2019-07-06 DIAGNOSIS — Z789 Other specified health status: Secondary | ICD-10-CM | POA: Diagnosis not present

## 2019-07-06 DIAGNOSIS — Z7189 Other specified counseling: Secondary | ICD-10-CM | POA: Diagnosis not present

## 2019-07-06 DIAGNOSIS — Z6826 Body mass index (BMI) 26.0-26.9, adult: Secondary | ICD-10-CM | POA: Diagnosis not present

## 2019-07-06 DIAGNOSIS — E7800 Pure hypercholesterolemia, unspecified: Secondary | ICD-10-CM | POA: Diagnosis not present

## 2019-07-06 DIAGNOSIS — Z79899 Other long term (current) drug therapy: Secondary | ICD-10-CM | POA: Diagnosis not present

## 2019-07-06 DIAGNOSIS — E559 Vitamin D deficiency, unspecified: Secondary | ICD-10-CM | POA: Diagnosis not present

## 2019-07-06 DIAGNOSIS — E039 Hypothyroidism, unspecified: Secondary | ICD-10-CM | POA: Diagnosis not present

## 2019-07-06 DIAGNOSIS — Z299 Encounter for prophylactic measures, unspecified: Secondary | ICD-10-CM | POA: Diagnosis not present

## 2019-07-06 DIAGNOSIS — Z1339 Encounter for screening examination for other mental health and behavioral disorders: Secondary | ICD-10-CM | POA: Diagnosis not present

## 2019-07-06 DIAGNOSIS — Z1211 Encounter for screening for malignant neoplasm of colon: Secondary | ICD-10-CM | POA: Diagnosis not present

## 2019-07-06 DIAGNOSIS — Z0000 Encounter for general adult medical examination without abnormal findings: Secondary | ICD-10-CM | POA: Diagnosis not present

## 2019-07-06 DIAGNOSIS — I10 Essential (primary) hypertension: Secondary | ICD-10-CM | POA: Diagnosis not present

## 2019-07-06 DIAGNOSIS — R5383 Other fatigue: Secondary | ICD-10-CM | POA: Diagnosis not present

## 2019-07-31 DIAGNOSIS — I10 Essential (primary) hypertension: Secondary | ICD-10-CM | POA: Diagnosis not present

## 2019-07-31 DIAGNOSIS — F329 Major depressive disorder, single episode, unspecified: Secondary | ICD-10-CM | POA: Diagnosis not present

## 2019-08-31 DIAGNOSIS — F329 Major depressive disorder, single episode, unspecified: Secondary | ICD-10-CM | POA: Diagnosis not present

## 2019-08-31 DIAGNOSIS — I10 Essential (primary) hypertension: Secondary | ICD-10-CM | POA: Diagnosis not present

## 2019-09-30 DIAGNOSIS — F329 Major depressive disorder, single episode, unspecified: Secondary | ICD-10-CM | POA: Diagnosis not present

## 2019-09-30 DIAGNOSIS — Z6826 Body mass index (BMI) 26.0-26.9, adult: Secondary | ICD-10-CM | POA: Diagnosis not present

## 2019-09-30 DIAGNOSIS — E039 Hypothyroidism, unspecified: Secondary | ICD-10-CM | POA: Diagnosis not present

## 2019-09-30 DIAGNOSIS — Z299 Encounter for prophylactic measures, unspecified: Secondary | ICD-10-CM | POA: Diagnosis not present

## 2019-09-30 DIAGNOSIS — N183 Chronic kidney disease, stage 3 unspecified: Secondary | ICD-10-CM | POA: Diagnosis not present

## 2019-09-30 DIAGNOSIS — I10 Essential (primary) hypertension: Secondary | ICD-10-CM | POA: Diagnosis not present

## 2019-10-19 ENCOUNTER — Other Ambulatory Visit: Payer: Self-pay

## 2019-10-19 DIAGNOSIS — E038 Other specified hypothyroidism: Secondary | ICD-10-CM | POA: Diagnosis not present

## 2019-10-19 DIAGNOSIS — E063 Autoimmune thyroiditis: Secondary | ICD-10-CM | POA: Diagnosis not present

## 2019-10-20 LAB — T4, FREE: Free T4: 1.5 ng/dL (ref 0.8–1.8)

## 2019-10-20 LAB — TSH: TSH: 1.5 mIU/L (ref 0.40–4.50)

## 2019-10-27 ENCOUNTER — Telehealth (INDEPENDENT_AMBULATORY_CARE_PROVIDER_SITE_OTHER): Payer: Medicare Other | Admitting: Nurse Practitioner

## 2019-10-27 ENCOUNTER — Other Ambulatory Visit: Payer: Self-pay

## 2019-10-27 ENCOUNTER — Encounter: Payer: Self-pay | Admitting: Nurse Practitioner

## 2019-10-27 DIAGNOSIS — E038 Other specified hypothyroidism: Secondary | ICD-10-CM

## 2019-10-27 DIAGNOSIS — F329 Major depressive disorder, single episode, unspecified: Secondary | ICD-10-CM | POA: Diagnosis not present

## 2019-10-27 DIAGNOSIS — E063 Autoimmune thyroiditis: Secondary | ICD-10-CM

## 2019-10-27 DIAGNOSIS — I10 Essential (primary) hypertension: Secondary | ICD-10-CM | POA: Diagnosis not present

## 2019-10-27 MED ORDER — SYNTHROID 112 MCG PO TABS: ORAL_TABLET | ORAL | 3 refills | Status: DC

## 2019-10-27 NOTE — Patient Instructions (Signed)
-   The correct intake of thyroid hormone (Levothyroxine, Synthroid), is on empty stomach first thing in the morning, with water, separated by at least 30 minutes from breakfast and other medications,  and separated by more than 4 hours from calcium, iron, multivitamins, acid reflux medications (PPIs). ° °- This medication is a life-long medication and will be needed to correct thyroid hormone imbalances for the rest of your life.  The dose may change from time to time, based on thyroid blood work. ° °- It is extremely important to be consistent taking this medication, near the same time each morning. °

## 2019-10-27 NOTE — Progress Notes (Addendum)
10/27/2019                        Endocrinology Follow up Visit   TELEHEALTH VISIT: The patient is being engaged in telehealth visit due to COVID-19.  This type of visit limits physical examination significantly, and thus is not preferable over face-to-face encounters.  I connected with  Lori Dorsey on 10/27/19 by a video enabled telemedicine application and verified that I am speaking with the correct person using two identifiers.   I discussed the limitations of evaluation and management by telemedicine. The patient expressed understanding and agreed to proceed.    The participants involved in this visit include: Brita Romp, NP located at [facility] and [place]  located at their personal residence listed.   Subjective:    Patient ID: Lori Dorsey, female    DOB: [birth year], PCP Monico Blitz, MD   Past Medical History:  Diagnosis Date  . Cancer ([place])   . Hypertension   . Thyroid disease    Past Surgical History:  Procedure Laterality Date  . ABDOMINAL HYSTERECTOMY    . BREAST SURGERY    . CHOLECYSTECTOMY     Jan. 10 th 2017  . MASTECTOMY     right   Social History   Socioeconomic History  . Marital status: Married    Spouse name: Not on file  . Number of children: Not on file  . Years of education: Not on file  . Highest education level: Not on file  Occupational History  . Not on file  Tobacco Use  . Smoking status: Never Smoker  . Smokeless tobacco: Never Used  Vaping Use  . Vaping Use: Never used  Substance and Sexual Activity  . Alcohol use: No  . Drug use: No  . Sexual activity: Not on file  Other Topics Concern  . Not on file  Social History Narrative  . Not on file   Social Determinants of Health   Financial Resource Strain:   . Difficulty of Paying Living Expenses: Not on file  Food Insecurity:   . Worried About Charity fundraiser in the Last Year: Not on file  . Ran Out of Food in the Last  Year: Not on file  Transportation Needs:   . Lack of Transportation (Medical): Not on file  . Lack of Transportation (Non-Medical): Not on file  Physical Activity:   . Days of Exercise per Week: Not on file  . Minutes of Exercise per Session: Not on file  Stress:   . Feeling of Stress : Not on file  Social Connections:   . Frequency of Communication with Friends and Family: Not on file  . Frequency of Social Gatherings with Friends and Family: Not on file  . Attends Religious Services: Not on file  . Active Member of Clubs or Organizations: Not on file  . Attends Archivist Meetings: Not on file  . Marital Status: Not on file   Outpatient Encounter Medications as of 10/27/2019  Medication Sig  . atenolol (TENORMIN) 25 MG tablet Take 25 mg by mouth 2 (two) times daily.  . Cholecalciferol (VITAMIN D) 2000 units CAPS Take by mouth.  . citalopram (CELEXA) 20 MG tablet Take 20 mg by mouth daily.  . folic acid (FOLVITE) 992 MCG tablet Take 800 mcg by mouth daily.  . Magnesium 250 MG TABS Take by mouth 2 (two) times daily.  Marland Kitchen pyridOXINE (VITAMIN B-6)  100 MG tablet Take 100 mg by mouth daily.  Marland Kitchen SYNTHROID 112 MCG tablet TAKE ONE TABLET BY MOUTH ONCE DAILY BEFORE  BREAKFAST  . [DISCONTINUED] SYNTHROID 112 MCG tablet TAKE ONE TABLET BY MOUTH ONCE DAILY BEFORE  BREAKFAST   No facility-administered encounter medications on file as of 10/27/2019.   ALLERGIES: No Known Allergies VACCINATION STATUS:  There is no immunization history on file for this patient.  Thyroid Problem Presents for follow-up visit. Patient reports no anxiety, cold intolerance, constipation, diarrhea, fatigue, heat intolerance, tremors, weight gain or weight loss. The symptoms have been stable.   74 year old female patient with the medical history as above. She is being engaged in telehealth for follow-up of longstanding hypothyroidism.   She has been on Synthroid 112 mcg p.o. daily before breakfast for the  last several years.  Her most recent thyroid function test show adequate hormone replacement. -She denies any new complaints today.  She has a steady body weight, denies palpitation, heat intolerance, and tremors. She has family history of thyroid dysfunction in one of her siblings and one of her daughters. -She denies any family history of thyroid cancer. She reports compliance to her medications.  Review of Systems  Constitutional: Negative for fatigue, weight gain and weight loss.  Gastrointestinal: Negative for constipation and diarrhea.  Endocrine: Negative for cold intolerance and heat intolerance.  Neurological: Negative for tremors.  Psychiatric/Behavioral: The patient is not nervous/anxious.     Objective:    There were no vitals taken for this visit.  Wt Readings from Last 3 Encounters:  06/18/17 164 lb (74.4 kg)  06/18/16 162 lb (73.5 kg)  12/19/15 158 lb (71.7 kg)    BP Readings from Last 3 Encounters:  06/18/17 134/83  06/18/16 128/74  12/19/15 137/88     Physical Exam Physical Exam- Telehealth- significantly limited due to nature of visit  Constitutional: There is no height or weight on file to calculate BMI. , not in acute distress, normal state of mind Respiratory: Adequate breathing efforts   Recent Results (from the past 2160 hour(s))  TSH     Status: None   Collection Time: 10/19/19 11:57 AM  Result Value Ref Range   TSH 1.50 0.40 - 4.50 mIU/L  T4, Free     Status: None   Collection Time: 10/19/19 11:57 AM  Result Value Ref Range   Free T4 1.5 0.8 - 1.8 ng/dL    Assessment & Plan:   1. Hypothyroidism -The cause for her hypothyroidism is Hashimoto's thyroiditis.  -Her previsit thyroid function test are consistent with appropriate replacement.  She is advised to continue Synthroid 112 mcg p.o. daily before breakfast.  Will recheck her thyroid profile prior to next visit in 1 year.   - The correct intake of thyroid hormone (Levothyroxine,  Synthroid), is on empty stomach first thing in the morning, with water, separated by at least 30 minutes from breakfast and other medications,  and separated by more than 4 hours from calcium, iron, multivitamins, acid reflux medications (PPIs).  - This medication is a life-long medication and will be needed to correct thyroid hormone imbalances for the rest of your life.  The dose may change from time to time, based on thyroid blood work.  - It is extremely important to be consistent taking this medication, near the same time each morning.  - I advised patient to maintain close follow up with Monico Blitz, MD for primary care needs.  I spent 20 minutes dedicated to the care  of this patient on the date of this encounter to include pre-visit review of records, face-to-face time with the patient, and post visit ordering of  testing. Lori Dorsey  participated in the discussions, expressed understanding, and voiced agreement with the above plans.  All questions were answered to her satisfaction. she is encouraged to contact clinic should she have any questions or concerns prior to her return visit.   Follow up plan: Return in about 1 year (around 10/26/2020) for Thyroid follow up, Previsit labs, Virtual visit ok.  Lori Pigg, FNP-BC [facility] Phone: [phone] Fax: [phone] 10/27/2019, 9:38 AM

## 2019-10-27 NOTE — Addendum Note (Signed)
Addended by: Brita Romp on: 10/27/2019 09:48 AM   Modules accepted: Orders

## 2019-11-01 DIAGNOSIS — I10 Essential (primary) hypertension: Secondary | ICD-10-CM | POA: Diagnosis not present

## 2019-11-24 DIAGNOSIS — Z23 Encounter for immunization: Secondary | ICD-10-CM | POA: Diagnosis not present

## 2019-12-01 DIAGNOSIS — I10 Essential (primary) hypertension: Secondary | ICD-10-CM | POA: Diagnosis not present

## 2019-12-26 ENCOUNTER — Other Ambulatory Visit: Payer: Self-pay | Admitting: "Endocrinology

## 2019-12-26 DIAGNOSIS — E038 Other specified hypothyroidism: Secondary | ICD-10-CM

## 2019-12-30 DIAGNOSIS — I10 Essential (primary) hypertension: Secondary | ICD-10-CM | POA: Diagnosis not present

## 2019-12-30 DIAGNOSIS — F329 Major depressive disorder, single episode, unspecified: Secondary | ICD-10-CM | POA: Diagnosis not present

## 2019-12-31 DIAGNOSIS — I10 Essential (primary) hypertension: Secondary | ICD-10-CM | POA: Diagnosis not present

## 2020-01-04 DIAGNOSIS — D3131 Benign neoplasm of right choroid: Secondary | ICD-10-CM | POA: Diagnosis not present

## 2020-01-06 DIAGNOSIS — K219 Gastro-esophageal reflux disease without esophagitis: Secondary | ICD-10-CM | POA: Diagnosis not present

## 2020-01-06 DIAGNOSIS — Z713 Dietary counseling and surveillance: Secondary | ICD-10-CM | POA: Diagnosis not present

## 2020-01-06 DIAGNOSIS — Z6826 Body mass index (BMI) 26.0-26.9, adult: Secondary | ICD-10-CM | POA: Diagnosis not present

## 2020-01-06 DIAGNOSIS — I10 Essential (primary) hypertension: Secondary | ICD-10-CM | POA: Diagnosis not present

## 2020-01-06 DIAGNOSIS — Z299 Encounter for prophylactic measures, unspecified: Secondary | ICD-10-CM | POA: Diagnosis not present

## 2020-01-31 DIAGNOSIS — I10 Essential (primary) hypertension: Secondary | ICD-10-CM | POA: Diagnosis not present

## 2020-01-31 DIAGNOSIS — F329 Major depressive disorder, single episode, unspecified: Secondary | ICD-10-CM | POA: Diagnosis not present

## 2020-02-10 DIAGNOSIS — Z23 Encounter for immunization: Secondary | ICD-10-CM | POA: Diagnosis not present

## 2020-02-29 DIAGNOSIS — Z6826 Body mass index (BMI) 26.0-26.9, adult: Secondary | ICD-10-CM | POA: Diagnosis not present

## 2020-02-29 DIAGNOSIS — M25532 Pain in left wrist: Secondary | ICD-10-CM | POA: Diagnosis not present

## 2020-02-29 DIAGNOSIS — M79642 Pain in left hand: Secondary | ICD-10-CM | POA: Diagnosis not present

## 2020-02-29 DIAGNOSIS — I10 Essential (primary) hypertension: Secondary | ICD-10-CM | POA: Diagnosis not present

## 2020-02-29 DIAGNOSIS — S62009A Unspecified fracture of navicular [scaphoid] bone of unspecified wrist, initial encounter for closed fracture: Secondary | ICD-10-CM | POA: Diagnosis not present

## 2020-02-29 DIAGNOSIS — S62022A Displaced fracture of middle third of navicular [scaphoid] bone of left wrist, initial encounter for closed fracture: Secondary | ICD-10-CM | POA: Diagnosis not present

## 2020-02-29 DIAGNOSIS — Z299 Encounter for prophylactic measures, unspecified: Secondary | ICD-10-CM | POA: Diagnosis not present

## 2020-03-01 DIAGNOSIS — I10 Essential (primary) hypertension: Secondary | ICD-10-CM | POA: Diagnosis not present

## 2020-03-07 ENCOUNTER — Encounter: Payer: Self-pay | Admitting: Orthopaedic Surgery

## 2020-03-07 ENCOUNTER — Ambulatory Visit (INDEPENDENT_AMBULATORY_CARE_PROVIDER_SITE_OTHER): Payer: Medicare Other | Admitting: Orthopaedic Surgery

## 2020-03-07 ENCOUNTER — Other Ambulatory Visit: Payer: Self-pay

## 2020-03-07 VITALS — BP 127/80 | HR 96 | Ht 67.0 in | Wt 171.0 lb

## 2020-03-07 DIAGNOSIS — S62001A Unspecified fracture of navicular [scaphoid] bone of right wrist, initial encounter for closed fracture: Secondary | ICD-10-CM

## 2020-03-07 NOTE — Progress Notes (Signed)
Subjective:    Patient ID: Lori Dorsey, female    DOB: 06-07-1945, 75 y.o.   MRN: [id number]  HPI She fell and hurt her left wrist and hand about February 19, 2020.  She had persistent pain.  She went to [facility] and had x-rays done.  X-rays show a possible fracture of the left navicular of the wrist.  CT was recommended.  I will get the CT scan.  She does not have a splint.  I will give a splint with thumb extension to protect her wrist.  I have told her if the navicular is broken, it is the one bone that has the worst healing time of all the bones in the body.  She has no other injury.   Review of Systems  Constitutional: Positive for activity change.  Musculoskeletal: Positive for joint swelling.  All other systems reviewed and are negative.  For Review of Systems, all other systems reviewed and are negative.  The following is a summary of the past history medically, past history surgically, known current medicines, social history and family history.  This information is gathered electronically by the computer from prior information and documentation.  I review this each visit and have found including this information at this point in the chart is beneficial and informative.   Past Medical History:  Diagnosis Date  . Cancer ([facility])   . Hypertension   . Thyroid disease     Past Surgical History:  Procedure Laterality Date  . ABDOMINAL HYSTERECTOMY    . BREAST SURGERY    . CHOLECYSTECTOMY     Jan. 10 th 2017  . MASTECTOMY     right    Current Outpatient Medications on File Prior to Visit  Medication Sig Dispense Refill  . atenolol (TENORMIN) 25 MG tablet Take 25 mg by mouth 2 (two) times daily.    . Cholecalciferol (VITAMIN D) 2000 units CAPS Take by mouth.    . citalopram (CELEXA) 20 MG tablet Take 20 mg by mouth daily.    . folic acid (FOLVITE) 800 MCG tablet Take 800 mcg by mouth daily.    . Magnesium 250 MG TABS Take by mouth 2 (two) times daily.     Marland Kitchen pyridOXINE (VITAMIN B-6) 100 MG tablet Take 100 mg by mouth daily.    Marland Kitchen SYNTHROID 112 MCG tablet TAKE 1 TABLET BY MOUTH ONCE DAILY BEFORE BREAKFAST 90 tablet 1   No current facility-administered medications on file prior to visit.    Social History   Socioeconomic History  . Marital status: Married    Spouse name: Not on file  . Number of children: Not on file  . Years of education: Not on file  . Highest education level: Not on file  Occupational History  . Not on file  Tobacco Use  . Smoking status: Never Smoker  . Smokeless tobacco: Never Used  Vaping Use  . Vaping Use: Never used  Substance and Sexual Activity  . Alcohol use: No  . Drug use: No  . Sexual activity: Not on file  Other Topics Concern  . Not on file  Social History Narrative  . Not on file   Social Determinants of Health   Financial Resource Strain: Not on file  Food Insecurity: Not on file  Transportation Needs: Not on file  Physical Activity: Not on file  Stress: Not on file  Social Connections: Not on file  Intimate Partner Violence: Not on file    History  reviewed. No pertinent family history.  BP 127/80   Pulse 96   Ht 5\' 7"  (1.702 m)   Wt 171 lb (77.6 kg)   BMI 26.78 kg/m   Body mass index is 26.78 kg/m.      Objective:   Physical Exam Vitals and nursing note reviewed. Exam conducted with a chaperone present.  Constitutional:      Appearance: She is well-developed and well-nourished.  HENT:     Head: Normocephalic and atraumatic.  Eyes:     Extraocular Movements: EOM normal.     Conjunctiva/sclera: Conjunctivae normal.     Pupils: Pupils are equal, round, and reactive to light.  Cardiovascular:     Rate and Rhythm: Normal rate and regular rhythm.     Pulses: Intact distal pulses.  Pulmonary:     Effort: Pulmonary effort is normal.  Abdominal:     Palpations: Abdomen is soft.  Musculoskeletal:       Hands:     Cervical back: Normal range of motion and neck supple.   Skin:    General: Skin is warm and dry.  Neurological:     Mental Status: She is alert and oriented to person, place, and time.     Cranial Nerves: No cranial nerve deficit.     Motor: No abnormal muscle tone.     Coordination: Coordination normal.     Deep Tendon Reflexes: Reflexes are normal and symmetric. Reflexes normal.  Psychiatric:        Mood and Affect: Mood and affect normal.        Behavior: Behavior normal.        Thought Content: Thought content normal.        Judgment: Judgment normal.    I have independently reviewed and interpreted x-rays of this patient done at another site by another physician or qualified health professional.  I have reviewed the notes from [facility].         Assessment & Plan:   Encounter Diagnosis  Name Primary?  . Closed nondisplaced fracture of scaphoid of right wrist, unspecified portion of scaphoid, initial encounter Yes   I have given splint for the left wrist and thumb.  I have scheduled for CT of the wrist.  See in [place] Office Tuesday to go over results.  Call if any problem.  Precautions discussed.   Electronically Signed Thursday, MD 1/5/20229:33 AM

## 2020-03-07 NOTE — Patient Instructions (Signed)
Needs to get CT at [facility], see me in [facility] Tuesday.

## 2020-03-12 ENCOUNTER — Other Ambulatory Visit: Payer: Self-pay

## 2020-03-12 ENCOUNTER — Ambulatory Visit (HOSPITAL_COMMUNITY)
Admission: RE | Admit: 2020-03-12 | Discharge: 2020-03-12 | Disposition: A | Discharge status: Home / Self Care | Payer: Medicare Other | Source: Ambulatory Visit | Attending: Orthopaedic Surgery | Admitting: Orthopaedic Surgery

## 2020-03-12 DIAGNOSIS — S62001A Unspecified fracture of navicular [scaphoid] bone of right wrist, initial encounter for closed fracture: Secondary | ICD-10-CM | POA: Insufficient documentation

## 2020-03-12 DIAGNOSIS — S62015A Nondisplaced fracture of distal pole of navicular [scaphoid] bone of left wrist, initial encounter for closed fracture: Secondary | ICD-10-CM | POA: Diagnosis not present

## 2020-03-12 DIAGNOSIS — M25432 Effusion, left wrist: Secondary | ICD-10-CM | POA: Diagnosis not present

## 2020-03-13 ENCOUNTER — Ambulatory Visit (INDEPENDENT_AMBULATORY_CARE_PROVIDER_SITE_OTHER): Payer: Medicare Other | Admitting: Orthopaedic Surgery

## 2020-03-13 ENCOUNTER — Encounter: Payer: Self-pay | Admitting: Orthopaedic Surgery

## 2020-03-13 VITALS — BP 206/108 | HR 98 | Ht 67.0 in | Wt 170.0 lb

## 2020-03-13 DIAGNOSIS — S62002A Unspecified fracture of navicular [scaphoid] bone of left wrist, initial encounter for closed fracture: Secondary | ICD-10-CM | POA: Diagnosis not present

## 2020-03-13 NOTE — Progress Notes (Signed)
I got that scan  She had CT scan of the left wrist which showed:  IMPRESSION: 1. Nondisplaced fracture of the distal pole of the scaphoid. 2. Moderate wrist joint effusion. 3. Mild osteoarthritis of the radiocarpal joint, first [place] joint and first MCP joint. 4. Moderate osteoarthritis of the pisotriquetral joint.  I have explained the findings to her.  I have independently reviewed and interpreted x-rays of this patient done at another site by another physician or qualified health professional.    She will need to continue the splint for a couple of months.  She still might not heal and might need surgery.  She understands.  Encounter Diagnosis  Name Primary?  . Traumatic closed nondisplaced fracture of carpal scaphoid, left, initial encounter Yes   To see in [place] in three weeks.  X-rays then of the left scaphoid navicular.  Call if any problem.  Precautions discussed.   Electronically Signed Sanjuana Kava, MD 1/11/202210:34 AM

## 2020-04-02 DIAGNOSIS — I10 Essential (primary) hypertension: Secondary | ICD-10-CM | POA: Diagnosis not present

## 2020-04-02 DIAGNOSIS — K219 Gastro-esophageal reflux disease without esophagitis: Secondary | ICD-10-CM | POA: Diagnosis not present

## 2020-04-04 ENCOUNTER — Encounter: Payer: Self-pay | Admitting: Orthopaedic Surgery

## 2020-04-04 ENCOUNTER — Ambulatory Visit (INDEPENDENT_AMBULATORY_CARE_PROVIDER_SITE_OTHER): Payer: Medicare Other | Admitting: Orthopaedic Surgery

## 2020-04-04 ENCOUNTER — Ambulatory Visit: Payer: Self-pay

## 2020-04-04 VITALS — BP 117/58 | HR 88 | Ht 67.0 in | Wt 168.0 lb

## 2020-04-04 DIAGNOSIS — S62002D Unspecified fracture of navicular [scaphoid] bone of left wrist, subsequent encounter for fracture with routine healing: Secondary | ICD-10-CM

## 2020-04-04 DIAGNOSIS — S62001D Unspecified fracture of navicular [scaphoid] bone of right wrist, subsequent encounter for fracture with routine healing: Secondary | ICD-10-CM

## 2020-04-04 DIAGNOSIS — S62001A Unspecified fracture of navicular [scaphoid] bone of right wrist, initial encounter for closed fracture: Secondary | ICD-10-CM

## 2020-04-04 NOTE — Progress Notes (Signed)
My wrist does not hurt  She is wearing her splint on the left wrist/thumb.  She has no pain.  NV intact.  X-rays were done of the left wrist, reported separately.  Encounter Diagnoses  Name Primary?  . Traumatic closed nondisplaced fracture of carpal scaphoid, left, with routine healing, subsequent encounter   . Closed nondisplaced fracture of scaphoid of right wrist with routine healing, unspecified portion of scaphoid, subsequent encounter Yes   Continue the splint.  Return in one month, X-rays then.  Call if any problem.  Precautions discussed.   Electronically Signed Sanjuana Kava, MD 2/2/202210:31 AM

## 2020-04-24 DIAGNOSIS — I8393 Asymptomatic varicose veins of bilateral lower extremities: Secondary | ICD-10-CM | POA: Diagnosis not present

## 2020-04-24 DIAGNOSIS — Z299 Encounter for prophylactic measures, unspecified: Secondary | ICD-10-CM | POA: Diagnosis not present

## 2020-04-24 DIAGNOSIS — Z6826 Body mass index (BMI) 26.0-26.9, adult: Secondary | ICD-10-CM | POA: Diagnosis not present

## 2020-04-24 DIAGNOSIS — E039 Hypothyroidism, unspecified: Secondary | ICD-10-CM | POA: Diagnosis not present

## 2020-04-24 DIAGNOSIS — R42 Dizziness and giddiness: Secondary | ICD-10-CM | POA: Diagnosis not present

## 2020-04-24 DIAGNOSIS — Z789 Other specified health status: Secondary | ICD-10-CM | POA: Diagnosis not present

## 2020-04-24 DIAGNOSIS — I10 Essential (primary) hypertension: Secondary | ICD-10-CM | POA: Diagnosis not present

## 2020-04-30 ENCOUNTER — Ambulatory Visit: Payer: Medicare Other | Admitting: Nurse Practitioner

## 2020-04-30 DIAGNOSIS — K219 Gastro-esophageal reflux disease without esophagitis: Secondary | ICD-10-CM | POA: Diagnosis not present

## 2020-04-30 DIAGNOSIS — I10 Essential (primary) hypertension: Secondary | ICD-10-CM | POA: Diagnosis not present

## 2020-05-02 ENCOUNTER — Ambulatory Visit: Payer: Self-pay

## 2020-05-02 ENCOUNTER — Other Ambulatory Visit: Payer: Self-pay

## 2020-05-02 ENCOUNTER — Encounter: Payer: Self-pay | Admitting: Orthopaedic Surgery

## 2020-05-02 ENCOUNTER — Ambulatory Visit (INDEPENDENT_AMBULATORY_CARE_PROVIDER_SITE_OTHER): Payer: Medicare Other | Admitting: Orthopaedic Surgery

## 2020-05-02 VITALS — BP 115/56 | HR 88 | Ht 67.0 in | Wt 165.0 lb

## 2020-05-02 DIAGNOSIS — S62002D Unspecified fracture of navicular [scaphoid] bone of left wrist, subsequent encounter for fracture with routine healing: Secondary | ICD-10-CM | POA: Diagnosis not present

## 2020-05-02 DIAGNOSIS — S62001D Unspecified fracture of navicular [scaphoid] bone of right wrist, subsequent encounter for fracture with routine healing: Secondary | ICD-10-CM

## 2020-05-02 NOTE — Progress Notes (Signed)
My wrist feels good.  She has been wearing a splint on the left wrist and thumb for scaphoid fracture.  She has no pain, full ROM.  NV intact.  X-rays were done of the left wrist, reported separately.  Encounter Diagnosis  Name Primary?  . Closed nondisplaced fracture of scaphoid of right wrist with routine healing, unspecified portion of scaphoid, subsequent encounter Yes   I will have her come out of the splint.  Return in one month.  X-rays then.  Call if any problem.  Precautions discussed.   Electronically Signed Sanjuana Kava, MD 3/2/202210:25 AM

## 2020-05-30 DIAGNOSIS — J440 Chronic obstructive pulmonary disease with acute lower respiratory infection: Secondary | ICD-10-CM | POA: Diagnosis not present

## 2020-05-30 DIAGNOSIS — K219 Gastro-esophageal reflux disease without esophagitis: Secondary | ICD-10-CM | POA: Diagnosis not present

## 2020-06-13 ENCOUNTER — Ambulatory Visit: Payer: Self-pay

## 2020-06-13 ENCOUNTER — Encounter: Payer: Self-pay | Admitting: Orthopaedic Surgery

## 2020-06-13 ENCOUNTER — Ambulatory Visit (INDEPENDENT_AMBULATORY_CARE_PROVIDER_SITE_OTHER): Payer: Medicare Other | Admitting: Orthopaedic Surgery

## 2020-06-13 ENCOUNTER — Other Ambulatory Visit: Payer: Self-pay

## 2020-06-13 VITALS — Ht 67.0 in | Wt 164.0 lb

## 2020-06-13 DIAGNOSIS — S62001D Unspecified fracture of navicular [scaphoid] bone of right wrist, subsequent encounter for fracture with routine healing: Secondary | ICD-10-CM

## 2020-06-13 DIAGNOSIS — S62002D Unspecified fracture of navicular [scaphoid] bone of left wrist, subsequent encounter for fracture with routine healing: Secondary | ICD-10-CM

## 2020-06-13 NOTE — Progress Notes (Signed)
I have some pain in my wrist at times but otherwise it is OK.  She has only slight tenderness to the left wrist.  She has full ROM.  NV intact.  X-rays were done of the left wrist, reported separately.  Encounter Diagnosis  Name Primary?  . Closed nondisplaced fracture of scaphoid of right wrist with routine healing, unspecified portion of scaphoid, subsequent encounter Yes   Continue as she is doing.  I will see her back in six weeks for final X-rays.  Call if any problem.  Precautions discussed.   Electronically Signed Sanjuana Kava, MD 4/13/202210:22 AM

## 2020-06-26 ENCOUNTER — Other Ambulatory Visit: Payer: Self-pay | Admitting: Nurse Practitioner

## 2020-06-26 DIAGNOSIS — E038 Other specified hypothyroidism: Secondary | ICD-10-CM

## 2020-07-06 DIAGNOSIS — E039 Hypothyroidism, unspecified: Secondary | ICD-10-CM | POA: Diagnosis not present

## 2020-07-06 DIAGNOSIS — E559 Vitamin D deficiency, unspecified: Secondary | ICD-10-CM | POA: Diagnosis not present

## 2020-07-06 DIAGNOSIS — Z1339 Encounter for screening examination for other mental health and behavioral disorders: Secondary | ICD-10-CM | POA: Diagnosis not present

## 2020-07-06 DIAGNOSIS — Z0000 Encounter for general adult medical examination without abnormal findings: Secondary | ICD-10-CM | POA: Diagnosis not present

## 2020-07-06 DIAGNOSIS — Z79899 Other long term (current) drug therapy: Secondary | ICD-10-CM | POA: Diagnosis not present

## 2020-07-06 DIAGNOSIS — I10 Essential (primary) hypertension: Secondary | ICD-10-CM | POA: Diagnosis not present

## 2020-07-06 DIAGNOSIS — Z7189 Other specified counseling: Secondary | ICD-10-CM | POA: Diagnosis not present

## 2020-07-06 DIAGNOSIS — Z1331 Encounter for screening for depression: Secondary | ICD-10-CM | POA: Diagnosis not present

## 2020-07-06 DIAGNOSIS — E7800 Pure hypercholesterolemia, unspecified: Secondary | ICD-10-CM | POA: Diagnosis not present

## 2020-07-06 DIAGNOSIS — R5383 Other fatigue: Secondary | ICD-10-CM | POA: Diagnosis not present

## 2020-07-06 DIAGNOSIS — Z6826 Body mass index (BMI) 26.0-26.9, adult: Secondary | ICD-10-CM | POA: Diagnosis not present

## 2020-07-06 DIAGNOSIS — Z299 Encounter for prophylactic measures, unspecified: Secondary | ICD-10-CM | POA: Diagnosis not present

## 2020-07-06 DIAGNOSIS — Z87891 Personal history of nicotine dependence: Secondary | ICD-10-CM | POA: Diagnosis not present

## 2020-07-12 DIAGNOSIS — Z1231 Encounter for screening mammogram for malignant neoplasm of breast: Secondary | ICD-10-CM | POA: Diagnosis not present

## 2020-07-25 ENCOUNTER — Other Ambulatory Visit: Payer: Self-pay

## 2020-07-25 ENCOUNTER — Ambulatory Visit: Payer: Self-pay

## 2020-07-25 ENCOUNTER — Encounter: Payer: Self-pay | Admitting: Orthopaedic Surgery

## 2020-07-25 ENCOUNTER — Ambulatory Visit (INDEPENDENT_AMBULATORY_CARE_PROVIDER_SITE_OTHER): Payer: Medicare Other | Admitting: Orthopaedic Surgery

## 2020-07-25 VITALS — BP 172/111 | HR 86 | Ht 67.0 in | Wt 170.2 lb

## 2020-07-25 DIAGNOSIS — S62001D Unspecified fracture of navicular [scaphoid] bone of right wrist, subsequent encounter for fracture with routine healing: Secondary | ICD-10-CM

## 2020-07-25 DIAGNOSIS — S62002D Unspecified fracture of navicular [scaphoid] bone of left wrist, subsequent encounter for fracture with routine healing: Secondary | ICD-10-CM | POA: Diagnosis not present

## 2020-07-25 NOTE — Progress Notes (Signed)
I have no pain  She has full ROM of the left wrist.  NV intact.  X-rays were done of the left wrist, reported separately.  Encounter Diagnosis  Name Primary?  . Closed nondisplaced fracture of scaphoid of right wrist with routine healing, unspecified portion of scaphoid, subsequent encounter Yes   I will see as needed.  Call if any problem.  Precautions discussed.   X-rays were done of the

## 2020-08-20 DIAGNOSIS — E2839 Other primary ovarian failure: Secondary | ICD-10-CM | POA: Diagnosis not present

## 2020-08-30 DIAGNOSIS — G4700 Insomnia, unspecified: Secondary | ICD-10-CM | POA: Diagnosis not present

## 2020-08-30 DIAGNOSIS — E7800 Pure hypercholesterolemia, unspecified: Secondary | ICD-10-CM | POA: Diagnosis not present

## 2020-09-03 DIAGNOSIS — Z20822 Contact with and (suspected) exposure to covid-19: Secondary | ICD-10-CM | POA: Diagnosis not present

## 2020-09-25 ENCOUNTER — Other Ambulatory Visit: Payer: Self-pay | Admitting: Nurse Practitioner

## 2020-09-25 DIAGNOSIS — E038 Other specified hypothyroidism: Secondary | ICD-10-CM

## 2020-09-25 DIAGNOSIS — E063 Autoimmune thyroiditis: Secondary | ICD-10-CM

## 2020-09-27 ENCOUNTER — Other Ambulatory Visit: Payer: Self-pay | Admitting: Nurse Practitioner

## 2020-09-27 DIAGNOSIS — E038 Other specified hypothyroidism: Secondary | ICD-10-CM

## 2020-09-27 DIAGNOSIS — E063 Autoimmune thyroiditis: Secondary | ICD-10-CM

## 2020-10-25 ENCOUNTER — Ambulatory Visit: Payer: Medicare Other | Admitting: Nurse Practitioner

## 2020-10-29 ENCOUNTER — Ambulatory Visit: Payer: Medicare Other | Admitting: Nurse Practitioner

## 2020-10-31 DIAGNOSIS — G4700 Insomnia, unspecified: Secondary | ICD-10-CM | POA: Diagnosis not present

## 2020-10-31 DIAGNOSIS — E7800 Pure hypercholesterolemia, unspecified: Secondary | ICD-10-CM | POA: Diagnosis not present

## 2020-11-26 DIAGNOSIS — Z23 Encounter for immunization: Secondary | ICD-10-CM | POA: Diagnosis not present

## 2020-11-30 DIAGNOSIS — G629 Polyneuropathy, unspecified: Secondary | ICD-10-CM | POA: Diagnosis not present

## 2020-11-30 DIAGNOSIS — R5383 Other fatigue: Secondary | ICD-10-CM | POA: Diagnosis not present

## 2021-01-07 DIAGNOSIS — D3131 Benign neoplasm of right choroid: Secondary | ICD-10-CM | POA: Diagnosis not present

## 2021-01-08 DIAGNOSIS — Z20828 Contact with and (suspected) exposure to other viral communicable diseases: Secondary | ICD-10-CM | POA: Diagnosis not present

## 2021-01-15 DIAGNOSIS — Z299 Encounter for prophylactic measures, unspecified: Secondary | ICD-10-CM | POA: Diagnosis not present

## 2021-01-15 DIAGNOSIS — R32 Unspecified urinary incontinence: Secondary | ICD-10-CM | POA: Diagnosis not present

## 2021-01-15 DIAGNOSIS — M8580 Other specified disorders of bone density and structure, unspecified site: Secondary | ICD-10-CM | POA: Diagnosis not present

## 2021-01-15 DIAGNOSIS — R5383 Other fatigue: Secondary | ICD-10-CM | POA: Diagnosis not present

## 2021-01-15 DIAGNOSIS — I10 Essential (primary) hypertension: Secondary | ICD-10-CM | POA: Diagnosis not present

## 2021-01-25 IMAGING — CT CT WRIST*L* W/O CM
3 of 4 series · 13 of 35 positions shown, 14 images · non-contrast
Comparison: None.

CLINICAL DATA: Right wrist pain.  Scaphoid fracture.

EXAM:
CT OF THE LEFT WRIST WITHOUT CONTRAST
TECHNIQUE: Multidetector CT imaging was performed according to the standard
protocol. Multiplanar CT image reconstructions were also generated.

[Series 5: axial bone · axial · 0.31mm/px · z∈[+324,+396]mm · 4 of 73 slices shown, 5 images]
[im 13/73  soft-tissue]
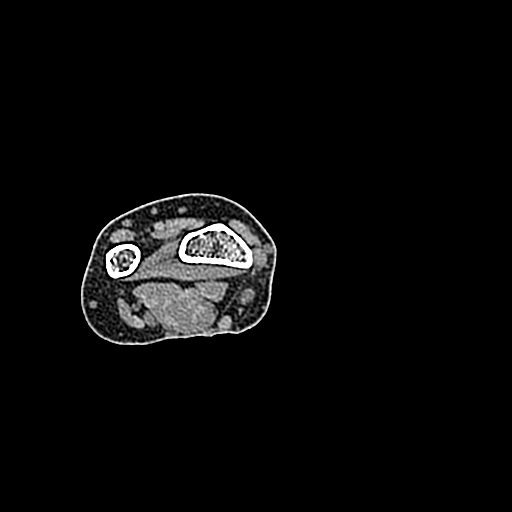
[im 13/73  bone]
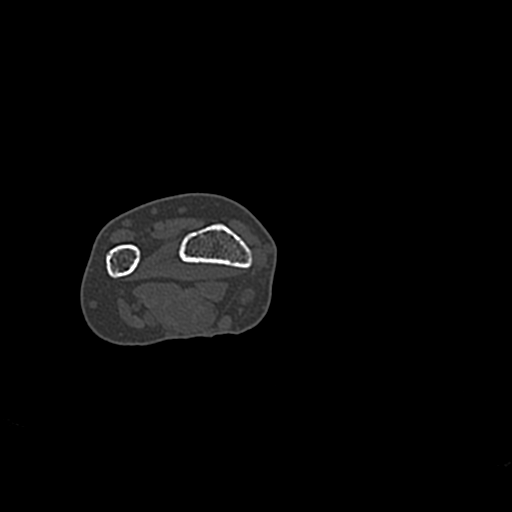
[im 25/73  bone]
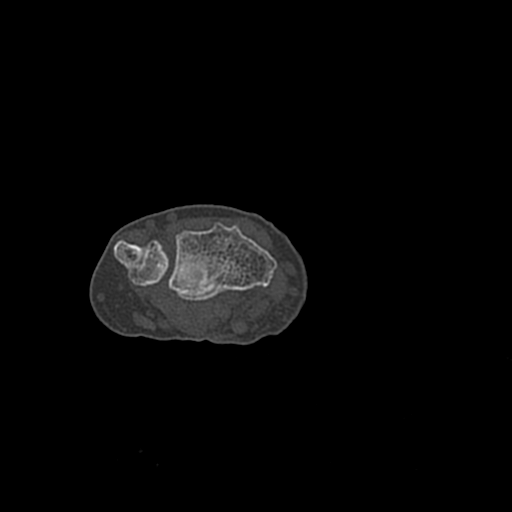
[im 49/73  bone]
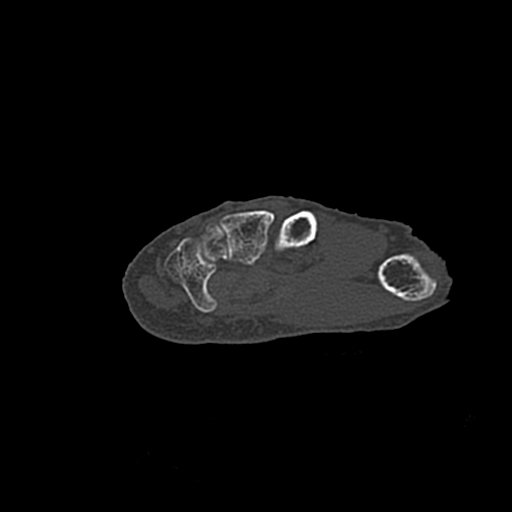
[im 61/73  bone]
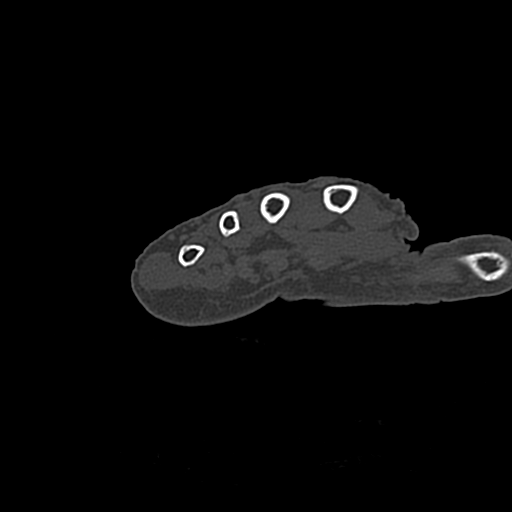

[Series 10: cor st · coronal · 0.22mm/px · 3 of 54 slices shown]
[im 11/54  bone]
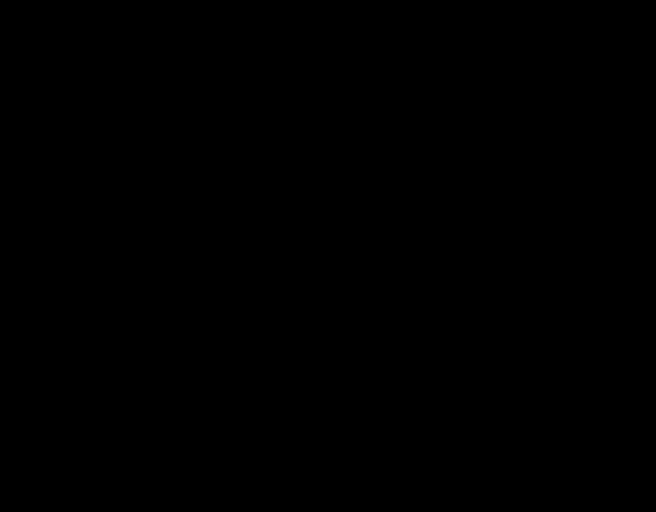
[im 22/54  bone]
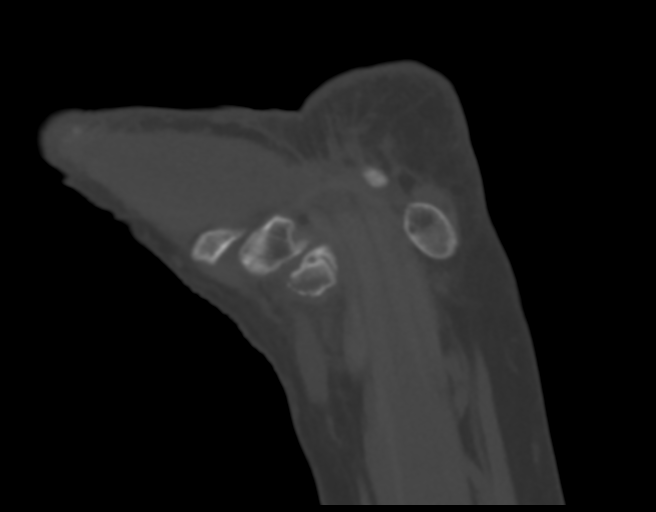
[im 32/54  bone]
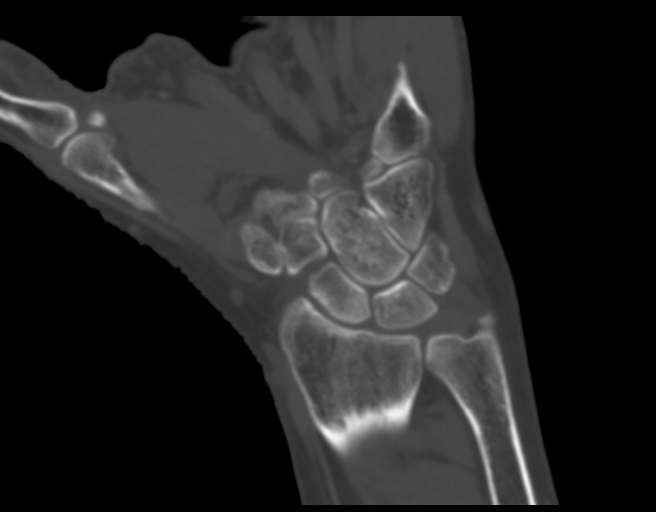

[Series 11: sag st · sagittal · 0.15mm/px · 6 of 66 slices shown]
[im 22/66  bone]
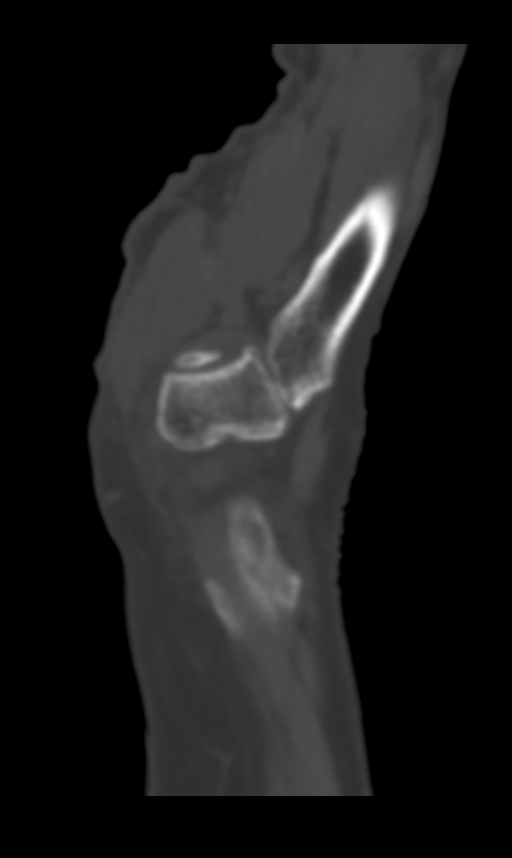
[im 28/66  bone]
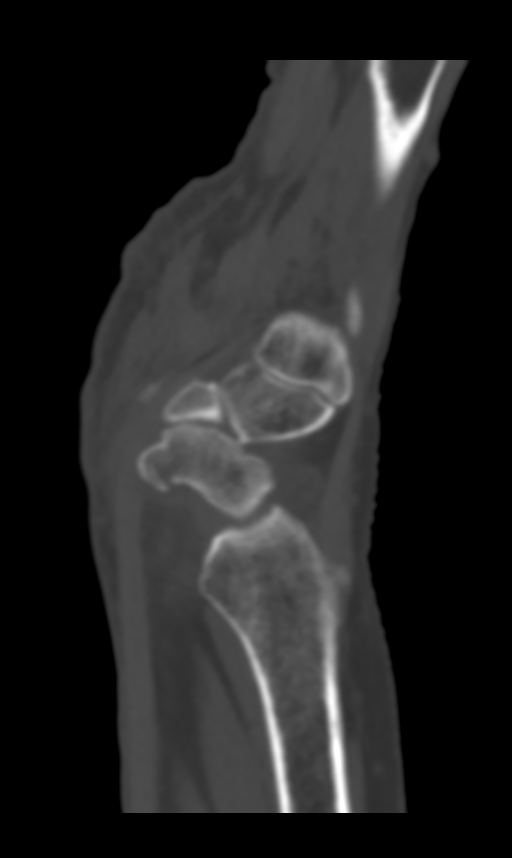
[im 33/66  bone]
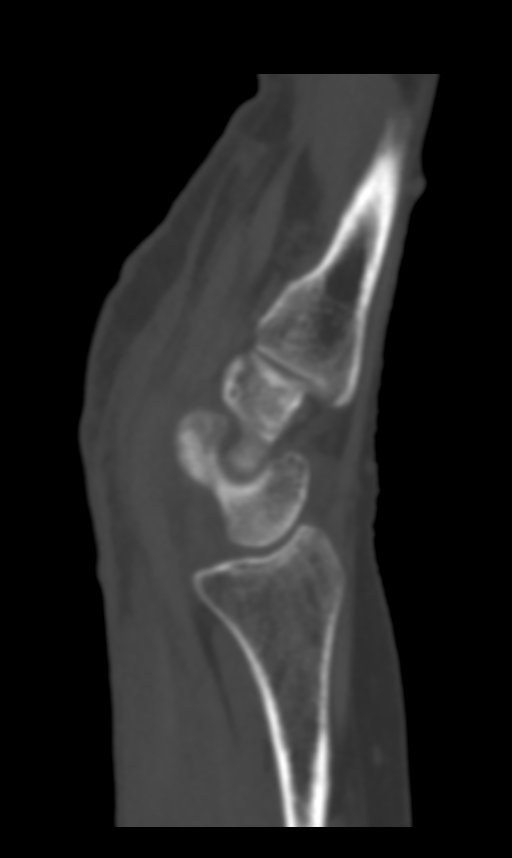
[im 34/66  soft-tissue]
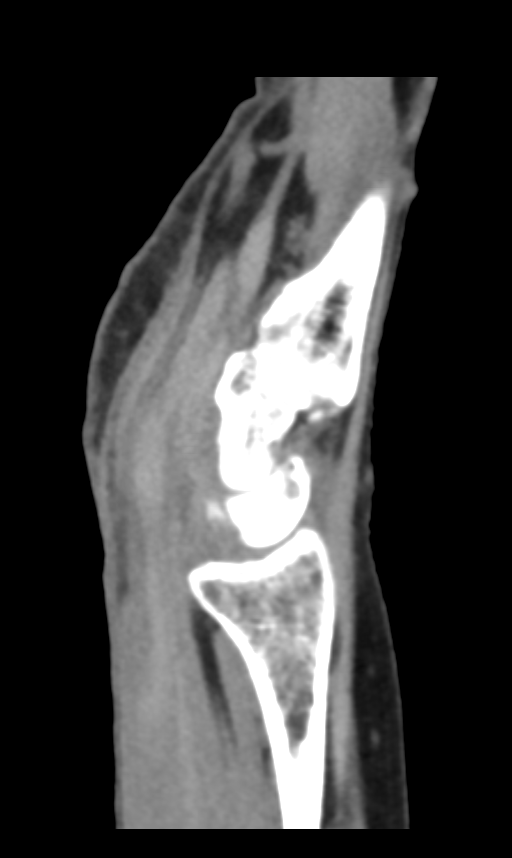
[im 38/66  bone]
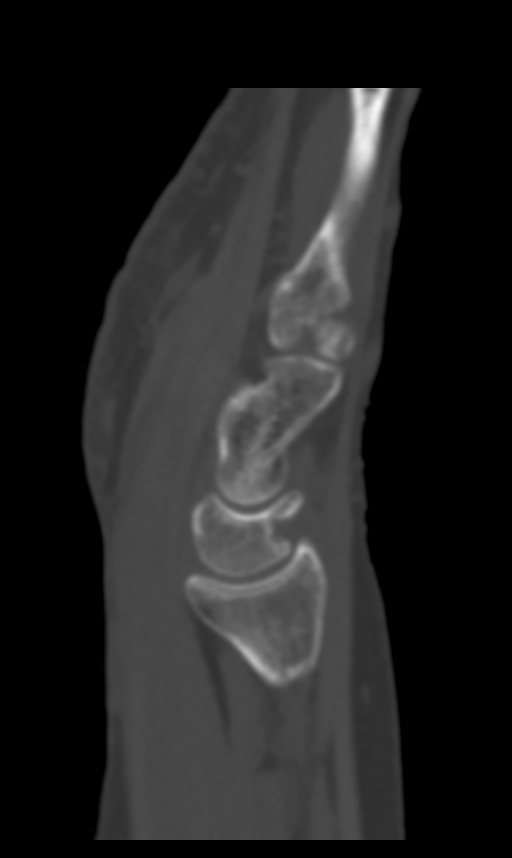
[im 44/66  bone]
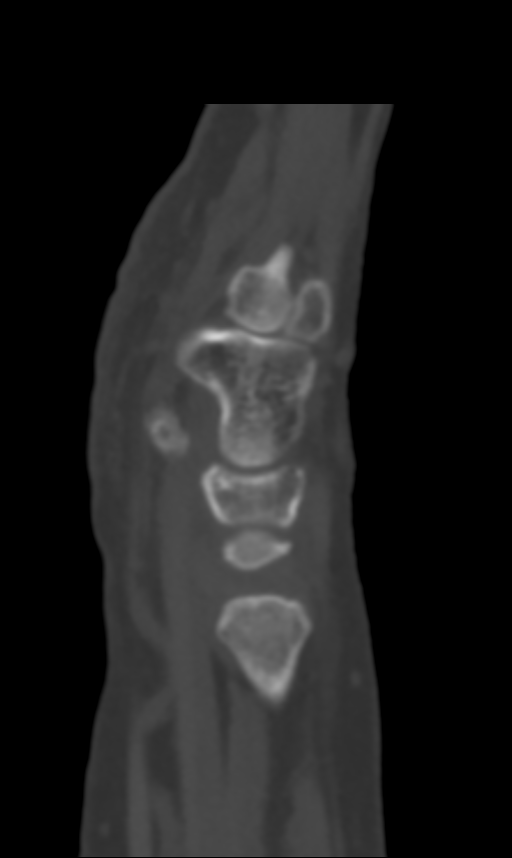

[13 of 35 positions shown; findings below may reference images not displayed]

FINDINGS: Bones/Joint/Cartilage

Nondisplaced fracture of the distal pole of the scaphoid.

No other fracture or dislocation.  No aggressive osseous lesion.

Mild osteoarthritis of the radiocarpal joint, first CMC joint and
first MCP joint. Moderate osteoarthritis of the pisotriquetral
joint. Normal alignment. Moderate wrist joint effusion.

Ligaments

Ligaments are suboptimally evaluated by CT.

Muscles and Tendons
Muscles are normal. No muscle atrophy. No intramuscular fluid
collection or hematoma. Flexor and extensor compartment tendons are
intact.

Soft tissue
No fluid collection or hematoma.  No soft tissue mass.
IMPRESSION: 1. Nondisplaced fracture of the distal pole of the scaphoid.
2. Moderate wrist joint effusion.
3. Mild osteoarthritis of the radiocarpal joint, first CMC joint and
first MCP joint.
4. Moderate osteoarthritis of the pisotriquetral joint.

## 2021-03-27 DIAGNOSIS — R519 Headache, unspecified: Secondary | ICD-10-CM | POA: Diagnosis not present

## 2021-03-27 DIAGNOSIS — E039 Hypothyroidism, unspecified: Secondary | ICD-10-CM | POA: Diagnosis not present

## 2021-03-27 DIAGNOSIS — I10 Essential (primary) hypertension: Secondary | ICD-10-CM | POA: Diagnosis not present

## 2021-03-27 DIAGNOSIS — R42 Dizziness and giddiness: Secondary | ICD-10-CM | POA: Diagnosis not present

## 2021-03-27 DIAGNOSIS — Z6826 Body mass index (BMI) 26.0-26.9, adult: Secondary | ICD-10-CM | POA: Diagnosis not present

## 2021-03-27 DIAGNOSIS — Z299 Encounter for prophylactic measures, unspecified: Secondary | ICD-10-CM | POA: Diagnosis not present

## 2021-03-27 DIAGNOSIS — Z789 Other specified health status: Secondary | ICD-10-CM | POA: Diagnosis not present

## 2021-03-30 DIAGNOSIS — K8689 Other specified diseases of pancreas: Secondary | ICD-10-CM | POA: Diagnosis not present

## 2021-03-30 DIAGNOSIS — Z853 Personal history of malignant neoplasm of breast: Secondary | ICD-10-CM | POA: Diagnosis not present

## 2021-03-30 DIAGNOSIS — K760 Fatty (change of) liver, not elsewhere classified: Secondary | ICD-10-CM | POA: Diagnosis not present

## 2021-03-30 DIAGNOSIS — R1013 Epigastric pain: Secondary | ICD-10-CM | POA: Diagnosis not present

## 2021-03-30 DIAGNOSIS — R5381 Other malaise: Secondary | ICD-10-CM | POA: Diagnosis not present

## 2021-03-30 DIAGNOSIS — R109 Unspecified abdominal pain: Secondary | ICD-10-CM | POA: Diagnosis not present

## 2021-03-30 DIAGNOSIS — Z9011 Acquired absence of right breast and nipple: Secondary | ICD-10-CM | POA: Diagnosis not present

## 2021-03-30 DIAGNOSIS — I10 Essential (primary) hypertension: Secondary | ICD-10-CM | POA: Diagnosis not present

## 2021-03-30 DIAGNOSIS — R0789 Other chest pain: Secondary | ICD-10-CM | POA: Diagnosis not present

## 2021-03-30 DIAGNOSIS — K869 Disease of pancreas, unspecified: Secondary | ICD-10-CM | POA: Diagnosis not present

## 2021-03-30 DIAGNOSIS — Z79899 Other long term (current) drug therapy: Secondary | ICD-10-CM | POA: Diagnosis not present

## 2021-03-30 DIAGNOSIS — I700 Atherosclerosis of aorta: Secondary | ICD-10-CM | POA: Diagnosis not present

## 2021-04-03 DIAGNOSIS — Z6826 Body mass index (BMI) 26.0-26.9, adult: Secondary | ICD-10-CM | POA: Diagnosis not present

## 2021-04-03 DIAGNOSIS — K219 Gastro-esophageal reflux disease without esophagitis: Secondary | ICD-10-CM | POA: Diagnosis not present

## 2021-04-03 DIAGNOSIS — Z299 Encounter for prophylactic measures, unspecified: Secondary | ICD-10-CM | POA: Diagnosis not present

## 2021-04-03 DIAGNOSIS — I700 Atherosclerosis of aorta: Secondary | ICD-10-CM | POA: Diagnosis not present

## 2021-04-03 DIAGNOSIS — Z789 Other specified health status: Secondary | ICD-10-CM | POA: Diagnosis not present

## 2021-04-03 DIAGNOSIS — K8689 Other specified diseases of pancreas: Secondary | ICD-10-CM | POA: Diagnosis not present

## 2021-04-03 DIAGNOSIS — I10 Essential (primary) hypertension: Secondary | ICD-10-CM | POA: Diagnosis not present

## 2021-04-08 DIAGNOSIS — Z20822 Contact with and (suspected) exposure to covid-19: Secondary | ICD-10-CM | POA: Diagnosis not present

## 2021-04-25 DIAGNOSIS — F419 Anxiety disorder, unspecified: Secondary | ICD-10-CM | POA: Diagnosis not present

## 2021-04-25 DIAGNOSIS — I10 Essential (primary) hypertension: Secondary | ICD-10-CM | POA: Diagnosis not present

## 2021-04-25 DIAGNOSIS — R935 Abnormal findings on diagnostic imaging of other abdominal regions, including retroperitoneum: Secondary | ICD-10-CM | POA: Diagnosis not present

## 2021-04-25 DIAGNOSIS — K8689 Other specified diseases of pancreas: Secondary | ICD-10-CM | POA: Diagnosis not present

## 2021-04-25 DIAGNOSIS — Z9049 Acquired absence of other specified parts of digestive tract: Secondary | ICD-10-CM | POA: Diagnosis not present

## 2021-04-25 DIAGNOSIS — D3A098 Benign carcinoid tumors of other sites: Secondary | ICD-10-CM | POA: Diagnosis not present

## 2021-04-25 DIAGNOSIS — D3A8 Other benign neuroendocrine tumors: Secondary | ICD-10-CM | POA: Diagnosis not present

## 2021-04-30 DIAGNOSIS — R5383 Other fatigue: Secondary | ICD-10-CM | POA: Diagnosis not present

## 2021-04-30 DIAGNOSIS — G629 Polyneuropathy, unspecified: Secondary | ICD-10-CM | POA: Diagnosis not present

## 2021-05-06 DIAGNOSIS — K219 Gastro-esophageal reflux disease without esophagitis: Secondary | ICD-10-CM | POA: Diagnosis not present

## 2021-05-06 DIAGNOSIS — Z6833 Body mass index (BMI) 33.0-33.9, adult: Secondary | ICD-10-CM | POA: Diagnosis not present

## 2021-05-06 DIAGNOSIS — K8689 Other specified diseases of pancreas: Secondary | ICD-10-CM | POA: Diagnosis not present

## 2021-05-06 DIAGNOSIS — I10 Essential (primary) hypertension: Secondary | ICD-10-CM | POA: Diagnosis not present

## 2021-05-06 DIAGNOSIS — Z6825 Body mass index (BMI) 25.0-25.9, adult: Secondary | ICD-10-CM | POA: Diagnosis not present

## 2021-05-06 DIAGNOSIS — Z299 Encounter for prophylactic measures, unspecified: Secondary | ICD-10-CM | POA: Diagnosis not present

## 2021-05-30 DIAGNOSIS — R5383 Other fatigue: Secondary | ICD-10-CM | POA: Diagnosis not present

## 2021-05-30 DIAGNOSIS — G629 Polyneuropathy, unspecified: Secondary | ICD-10-CM | POA: Diagnosis not present

## 2021-06-07 DIAGNOSIS — Z1152 Encounter for screening for COVID-19: Secondary | ICD-10-CM | POA: Diagnosis not present

## 2021-06-07 DIAGNOSIS — Z20828 Contact with and (suspected) exposure to other viral communicable diseases: Secondary | ICD-10-CM | POA: Diagnosis not present

## 2021-06-17 DIAGNOSIS — Z20822 Contact with and (suspected) exposure to covid-19: Secondary | ICD-10-CM | POA: Diagnosis not present

## 2021-06-30 DIAGNOSIS — E559 Vitamin D deficiency, unspecified: Secondary | ICD-10-CM | POA: Diagnosis not present

## 2021-06-30 DIAGNOSIS — I10 Essential (primary) hypertension: Secondary | ICD-10-CM | POA: Diagnosis not present

## 2021-07-03 DIAGNOSIS — R051 Acute cough: Secondary | ICD-10-CM | POA: Diagnosis not present

## 2021-07-03 DIAGNOSIS — Z20822 Contact with and (suspected) exposure to covid-19: Secondary | ICD-10-CM | POA: Diagnosis not present

## 2021-07-03 DIAGNOSIS — R059 Cough, unspecified: Secondary | ICD-10-CM | POA: Diagnosis not present

## 2021-07-10 DIAGNOSIS — Z1339 Encounter for screening examination for other mental health and behavioral disorders: Secondary | ICD-10-CM | POA: Diagnosis not present

## 2021-07-10 DIAGNOSIS — E039 Hypothyroidism, unspecified: Secondary | ICD-10-CM | POA: Diagnosis not present

## 2021-07-10 DIAGNOSIS — Z7189 Other specified counseling: Secondary | ICD-10-CM | POA: Diagnosis not present

## 2021-07-10 DIAGNOSIS — Z1211 Encounter for screening for malignant neoplasm of colon: Secondary | ICD-10-CM | POA: Diagnosis not present

## 2021-07-10 DIAGNOSIS — Z6825 Body mass index (BMI) 25.0-25.9, adult: Secondary | ICD-10-CM | POA: Diagnosis not present

## 2021-07-10 DIAGNOSIS — Z0000 Encounter for general adult medical examination without abnormal findings: Secondary | ICD-10-CM | POA: Diagnosis not present

## 2021-07-10 DIAGNOSIS — E559 Vitamin D deficiency, unspecified: Secondary | ICD-10-CM | POA: Diagnosis not present

## 2021-07-10 DIAGNOSIS — Z79899 Other long term (current) drug therapy: Secondary | ICD-10-CM | POA: Diagnosis not present

## 2021-07-10 DIAGNOSIS — E7800 Pure hypercholesterolemia, unspecified: Secondary | ICD-10-CM | POA: Diagnosis not present

## 2021-07-10 DIAGNOSIS — Z299 Encounter for prophylactic measures, unspecified: Secondary | ICD-10-CM | POA: Diagnosis not present

## 2021-07-10 DIAGNOSIS — I10 Essential (primary) hypertension: Secondary | ICD-10-CM | POA: Diagnosis not present

## 2021-07-10 DIAGNOSIS — R5383 Other fatigue: Secondary | ICD-10-CM | POA: Diagnosis not present

## 2021-07-10 DIAGNOSIS — Z1331 Encounter for screening for depression: Secondary | ICD-10-CM | POA: Diagnosis not present

## 2021-10-11 DIAGNOSIS — K8689 Other specified diseases of pancreas: Secondary | ICD-10-CM | POA: Diagnosis not present

## 2021-10-11 DIAGNOSIS — I10 Essential (primary) hypertension: Secondary | ICD-10-CM | POA: Diagnosis not present

## 2021-10-11 DIAGNOSIS — D3A8 Other benign neuroendocrine tumors: Secondary | ICD-10-CM | POA: Diagnosis not present

## 2021-10-11 DIAGNOSIS — Z299 Encounter for prophylactic measures, unspecified: Secondary | ICD-10-CM | POA: Diagnosis not present

## 2021-10-18 DIAGNOSIS — D3501 Benign neoplasm of right adrenal gland: Secondary | ICD-10-CM | POA: Diagnosis not present

## 2021-10-18 DIAGNOSIS — C254 Malignant neoplasm of endocrine pancreas: Secondary | ICD-10-CM | POA: Diagnosis not present

## 2021-10-18 DIAGNOSIS — Z8603 Personal history of neoplasm of uncertain behavior: Secondary | ICD-10-CM | POA: Diagnosis not present

## 2021-10-18 DIAGNOSIS — I700 Atherosclerosis of aorta: Secondary | ICD-10-CM | POA: Diagnosis not present

## 2021-10-18 DIAGNOSIS — R0989 Other specified symptoms and signs involving the circulatory and respiratory systems: Secondary | ICD-10-CM | POA: Diagnosis not present

## 2021-10-18 DIAGNOSIS — M4696 Unspecified inflammatory spondylopathy, lumbar region: Secondary | ICD-10-CM | POA: Diagnosis not present

## 2021-10-18 DIAGNOSIS — K8689 Other specified diseases of pancreas: Secondary | ICD-10-CM | POA: Diagnosis not present

## 2021-11-11 DIAGNOSIS — Z23 Encounter for immunization: Secondary | ICD-10-CM | POA: Diagnosis not present

## 2021-12-13 DIAGNOSIS — D3501 Benign neoplasm of right adrenal gland: Secondary | ICD-10-CM | POA: Diagnosis not present

## 2021-12-13 DIAGNOSIS — I10 Essential (primary) hypertension: Secondary | ICD-10-CM | POA: Diagnosis not present

## 2021-12-13 DIAGNOSIS — Z6826 Body mass index (BMI) 26.0-26.9, adult: Secondary | ICD-10-CM | POA: Diagnosis not present

## 2021-12-13 DIAGNOSIS — Z299 Encounter for prophylactic measures, unspecified: Secondary | ICD-10-CM | POA: Diagnosis not present

## 2021-12-13 DIAGNOSIS — R200 Anesthesia of skin: Secondary | ICD-10-CM | POA: Diagnosis not present

## 2021-12-13 DIAGNOSIS — I700 Atherosclerosis of aorta: Secondary | ICD-10-CM | POA: Diagnosis not present

## 2022-03-17 DIAGNOSIS — R5383 Other fatigue: Secondary | ICD-10-CM | POA: Diagnosis not present

## 2022-03-17 DIAGNOSIS — I10 Essential (primary) hypertension: Secondary | ICD-10-CM | POA: Diagnosis not present

## 2022-03-17 DIAGNOSIS — Z299 Encounter for prophylactic measures, unspecified: Secondary | ICD-10-CM | POA: Diagnosis not present

## 2022-05-12 DIAGNOSIS — Z1231 Encounter for screening mammogram for malignant neoplasm of breast: Secondary | ICD-10-CM | POA: Diagnosis not present

## 2022-07-14 DIAGNOSIS — I700 Atherosclerosis of aorta: Secondary | ICD-10-CM | POA: Diagnosis not present

## 2022-07-14 DIAGNOSIS — E039 Hypothyroidism, unspecified: Secondary | ICD-10-CM | POA: Diagnosis not present

## 2022-07-14 DIAGNOSIS — Z7189 Other specified counseling: Secondary | ICD-10-CM | POA: Diagnosis not present

## 2022-07-14 DIAGNOSIS — Z1339 Encounter for screening examination for other mental health and behavioral disorders: Secondary | ICD-10-CM | POA: Diagnosis not present

## 2022-07-14 DIAGNOSIS — I10 Essential (primary) hypertension: Secondary | ICD-10-CM | POA: Diagnosis not present

## 2022-07-14 DIAGNOSIS — Z299 Encounter for prophylactic measures, unspecified: Secondary | ICD-10-CM | POA: Diagnosis not present

## 2022-07-14 DIAGNOSIS — R5383 Other fatigue: Secondary | ICD-10-CM | POA: Diagnosis not present

## 2022-07-14 DIAGNOSIS — Z1331 Encounter for screening for depression: Secondary | ICD-10-CM | POA: Diagnosis not present

## 2022-07-14 DIAGNOSIS — Z0000 Encounter for general adult medical examination without abnormal findings: Secondary | ICD-10-CM | POA: Diagnosis not present

## 2022-07-14 DIAGNOSIS — Z1211 Encounter for screening for malignant neoplasm of colon: Secondary | ICD-10-CM | POA: Diagnosis not present

## 2022-08-25 DIAGNOSIS — E2839 Other primary ovarian failure: Secondary | ICD-10-CM | POA: Diagnosis not present

## 2022-10-23 DIAGNOSIS — S9001XA Contusion of right ankle, initial encounter: Secondary | ICD-10-CM | POA: Diagnosis not present

## 2022-10-23 DIAGNOSIS — Z9181 History of falling: Secondary | ICD-10-CM | POA: Diagnosis not present

## 2022-10-23 DIAGNOSIS — M25571 Pain in right ankle and joints of right foot: Secondary | ICD-10-CM | POA: Diagnosis not present

## 2022-10-23 DIAGNOSIS — M25532 Pain in left wrist: Secondary | ICD-10-CM | POA: Diagnosis not present

## 2022-10-24 DIAGNOSIS — N183 Chronic kidney disease, stage 3 unspecified: Secondary | ICD-10-CM | POA: Diagnosis not present

## 2022-11-27 DIAGNOSIS — Z23 Encounter for immunization: Secondary | ICD-10-CM | POA: Diagnosis not present

## 2023-01-14 DIAGNOSIS — N1831 Chronic kidney disease, stage 3a: Secondary | ICD-10-CM | POA: Diagnosis not present

## 2023-01-14 DIAGNOSIS — I10 Essential (primary) hypertension: Secondary | ICD-10-CM | POA: Diagnosis not present

## 2023-01-14 DIAGNOSIS — Z299 Encounter for prophylactic measures, unspecified: Secondary | ICD-10-CM | POA: Diagnosis not present

## 2023-06-08 DIAGNOSIS — D3132 Benign neoplasm of left choroid: Secondary | ICD-10-CM | POA: Diagnosis not present

## 2023-06-25 DIAGNOSIS — N390 Urinary tract infection, site not specified: Secondary | ICD-10-CM | POA: Diagnosis not present

## 2023-06-25 DIAGNOSIS — R52 Pain, unspecified: Secondary | ICD-10-CM | POA: Diagnosis not present

## 2023-06-25 DIAGNOSIS — Z299 Encounter for prophylactic measures, unspecified: Secondary | ICD-10-CM | POA: Diagnosis not present

## 2023-06-25 DIAGNOSIS — I10 Essential (primary) hypertension: Secondary | ICD-10-CM | POA: Diagnosis not present

## 2023-06-25 DIAGNOSIS — I700 Atherosclerosis of aorta: Secondary | ICD-10-CM | POA: Diagnosis not present

## 2023-07-09 DIAGNOSIS — Z1231 Encounter for screening mammogram for malignant neoplasm of breast: Secondary | ICD-10-CM | POA: Diagnosis not present

## 2023-07-15 DIAGNOSIS — Z0000 Encounter for general adult medical examination without abnormal findings: Secondary | ICD-10-CM | POA: Diagnosis not present

## 2023-07-15 DIAGNOSIS — Z1339 Encounter for screening examination for other mental health and behavioral disorders: Secondary | ICD-10-CM | POA: Diagnosis not present

## 2023-07-15 DIAGNOSIS — R5383 Other fatigue: Secondary | ICD-10-CM | POA: Diagnosis not present

## 2023-07-15 DIAGNOSIS — Z79899 Other long term (current) drug therapy: Secondary | ICD-10-CM | POA: Diagnosis not present

## 2023-07-15 DIAGNOSIS — Z299 Encounter for prophylactic measures, unspecified: Secondary | ICD-10-CM | POA: Diagnosis not present

## 2023-07-15 DIAGNOSIS — Z1331 Encounter for screening for depression: Secondary | ICD-10-CM | POA: Diagnosis not present

## 2023-07-15 DIAGNOSIS — E559 Vitamin D deficiency, unspecified: Secondary | ICD-10-CM | POA: Diagnosis not present

## 2023-07-15 DIAGNOSIS — E7800 Pure hypercholesterolemia, unspecified: Secondary | ICD-10-CM | POA: Diagnosis not present

## 2023-07-15 DIAGNOSIS — I10 Essential (primary) hypertension: Secondary | ICD-10-CM | POA: Diagnosis not present

## 2023-07-15 DIAGNOSIS — Z7189 Other specified counseling: Secondary | ICD-10-CM | POA: Diagnosis not present

## 2023-07-22 DIAGNOSIS — I10 Essential (primary) hypertension: Secondary | ICD-10-CM | POA: Diagnosis not present

## 2023-07-22 DIAGNOSIS — Z299 Encounter for prophylactic measures, unspecified: Secondary | ICD-10-CM | POA: Diagnosis not present

## 2023-07-22 DIAGNOSIS — L570 Actinic keratosis: Secondary | ICD-10-CM | POA: Diagnosis not present

## 2023-07-31 DIAGNOSIS — I10 Essential (primary) hypertension: Secondary | ICD-10-CM | POA: Diagnosis not present

## 2023-07-31 DIAGNOSIS — Z299 Encounter for prophylactic measures, unspecified: Secondary | ICD-10-CM | POA: Diagnosis not present

## 2023-07-31 DIAGNOSIS — N390 Urinary tract infection, site not specified: Secondary | ICD-10-CM | POA: Diagnosis not present

## 2023-08-12 DIAGNOSIS — Z299 Encounter for prophylactic measures, unspecified: Secondary | ICD-10-CM | POA: Diagnosis not present

## 2023-08-12 DIAGNOSIS — I10 Essential (primary) hypertension: Secondary | ICD-10-CM | POA: Diagnosis not present

## 2023-08-12 DIAGNOSIS — N390 Urinary tract infection, site not specified: Secondary | ICD-10-CM | POA: Diagnosis not present

## 2023-09-29 DIAGNOSIS — I10 Essential (primary) hypertension: Secondary | ICD-10-CM | POA: Diagnosis not present

## 2023-09-29 DIAGNOSIS — R350 Frequency of micturition: Secondary | ICD-10-CM | POA: Diagnosis not present

## 2023-09-29 DIAGNOSIS — N390 Urinary tract infection, site not specified: Secondary | ICD-10-CM | POA: Diagnosis not present

## 2023-09-29 DIAGNOSIS — Z299 Encounter for prophylactic measures, unspecified: Secondary | ICD-10-CM | POA: Diagnosis not present

## 2023-11-27 DIAGNOSIS — Z23 Encounter for immunization: Secondary | ICD-10-CM | POA: Diagnosis not present

## 2024-01-19 DIAGNOSIS — I10 Essential (primary) hypertension: Secondary | ICD-10-CM | POA: Diagnosis not present

## 2024-01-19 DIAGNOSIS — N1831 Chronic kidney disease, stage 3a: Secondary | ICD-10-CM | POA: Diagnosis not present

## 2024-01-19 DIAGNOSIS — Z299 Encounter for prophylactic measures, unspecified: Secondary | ICD-10-CM | POA: Diagnosis not present

## 2024-01-19 DIAGNOSIS — M549 Dorsalgia, unspecified: Secondary | ICD-10-CM | POA: Diagnosis not present
